# Patient Record
Sex: Female | Born: 1950 | Race: White | Hispanic: No | Marital: Single | Smoking: Current every day smoker
Health system: Southern US, Community
[De-identification: ages and names within clinical notes are randomized; demographics above are authoritative.]

## PROBLEM LIST (undated history)

## (undated) DIAGNOSIS — C801 Malignant (primary) neoplasm, unspecified: Secondary | ICD-10-CM

## (undated) DIAGNOSIS — E785 Hyperlipidemia, unspecified: Secondary | ICD-10-CM

## (undated) DIAGNOSIS — I10 Essential (primary) hypertension: Secondary | ICD-10-CM

## (undated) DIAGNOSIS — N189 Chronic kidney disease, unspecified: Secondary | ICD-10-CM

## (undated) DIAGNOSIS — N824 Other female intestinal-genital tract fistulae: Secondary | ICD-10-CM

## (undated) DIAGNOSIS — F419 Anxiety disorder, unspecified: Secondary | ICD-10-CM

## (undated) DIAGNOSIS — R011 Cardiac murmur, unspecified: Secondary | ICD-10-CM

## (undated) DIAGNOSIS — K5792 Diverticulitis of intestine, part unspecified, without perforation or abscess without bleeding: Secondary | ICD-10-CM

## (undated) DIAGNOSIS — M1990 Unspecified osteoarthritis, unspecified site: Secondary | ICD-10-CM

## (undated) HISTORY — DX: Essential (primary) hypertension: I10

## (undated) HISTORY — PX: BLADDER SURGERY: SHX569

## (undated) HISTORY — DX: Diverticulitis of intestine, part unspecified, without perforation or abscess without bleeding: K57.92

## (undated) HISTORY — PX: COLOSTOMY: SHX63

## (undated) HISTORY — PX: TUBAL LIGATION: SHX77

## (undated) HISTORY — DX: Anxiety disorder, unspecified: F41.9

## (undated) HISTORY — PX: OTHER SURGICAL HISTORY: SHX169

## (undated) HISTORY — DX: Unspecified osteoarthritis, unspecified site: M19.90

## (undated) HISTORY — DX: Malignant (primary) neoplasm, unspecified: C80.1

## (undated) HISTORY — DX: Hyperlipidemia, unspecified: E78.5

## (undated) HISTORY — PX: APPENDECTOMY: SHX54

---

## 2010-03-07 ENCOUNTER — Ambulatory Visit (HOSPITAL_COMMUNITY): Admission: RE | Admit: 2010-03-07 | Discharge: 2010-03-07 | Payer: Self-pay | Admitting: Urology

## 2010-03-13 ENCOUNTER — Ambulatory Visit (HOSPITAL_COMMUNITY)
Admission: RE | Admit: 2010-03-13 | Discharge: 2010-03-13 | Payer: Self-pay | Source: Home / Self Care | Admitting: Urology

## 2010-05-14 LAB — DIFFERENTIAL
Basophils Absolute: 0.1 10*3/uL (ref 0.0–0.1)
Basophils Relative: 1 % (ref 0–1)
Eosinophils Absolute: 0.2 10*3/uL (ref 0.0–0.7)
Eosinophils Relative: 1 % (ref 0–5)
Lymphocytes Relative: 30 % (ref 12–46)
Lymphs Abs: 4 10*3/uL (ref 0.7–4.0)
Monocytes Absolute: 0.8 10*3/uL (ref 0.1–1.0)
Monocytes Relative: 6 % (ref 3–12)
Neutro Abs: 8.2 10*3/uL — ABNORMAL HIGH (ref 1.7–7.7)
Neutrophils Relative %: 62 % (ref 43–77)

## 2010-05-14 LAB — BASIC METABOLIC PANEL
BUN: 10 mg/dL (ref 6–23)
CO2: 26 mEq/L (ref 19–32)
Calcium: 9.4 mg/dL (ref 8.4–10.5)
Chloride: 95 mEq/L — ABNORMAL LOW (ref 96–112)
Creatinine, Ser: 0.89 mg/dL (ref 0.4–1.2)
GFR calc Af Amer: >60 mL/min (ref >60)
GFR calc non Af Amer: >60 mL/min (ref >60)
Glucose, Bld: 81 mg/dL (ref 70–99)
Potassium: 4 mEq/L (ref 3.5–5.1)
Sodium: 134 mEq/L — ABNORMAL LOW (ref 135–145)

## 2010-05-14 LAB — CBC
HCT: 40.3 % (ref 36.0–46.0)
Hemoglobin: 13.8 g/dL (ref 12.0–15.0)
MCH: 29.1 pg (ref 26.0–34.0)
MCHC: 34.2 g/dL (ref 30.0–36.0)
MCV: 85 fL (ref 78.0–100.0)
Platelets: 338 10*3/uL (ref 150–400)
RBC: 4.74 MIL/uL (ref 3.87–5.11)
RDW: 13 % (ref 11.5–15.5)
WBC: 13.1 10*3/uL — ABNORMAL HIGH (ref 4.0–10.5)

## 2010-05-20 ENCOUNTER — Inpatient Hospital Stay (HOSPITAL_COMMUNITY)
Admission: RE | Admit: 2010-05-20 | Discharge: 2010-05-26 | Payer: Self-pay | Source: Home / Self Care | Attending: General Surgery | Admitting: General Surgery

## 2010-05-20 ENCOUNTER — Encounter (INDEPENDENT_AMBULATORY_CARE_PROVIDER_SITE_OTHER): Payer: Self-pay | Admitting: General Surgery

## 2010-05-26 LAB — BASIC METABOLIC PANEL
BUN: 8 mg/dL (ref 6–23)
BUN: 6 mg/dL (ref 6–23)
BUN: 12 mg/dL (ref 6–23)
CO2: 28 mEq/L (ref 19–32)
CO2: 25 mEq/L (ref 19–32)
CO2: 23 mEq/L (ref 19–32)
Calcium: 8.6 mg/dL (ref 8.4–10.5)
Calcium: 8.3 mg/dL — ABNORMAL LOW (ref 8.4–10.5)
Calcium: 8.2 mg/dL — ABNORMAL LOW (ref 8.4–10.5)
Chloride: 96 mEq/L (ref 96–112)
Chloride: 101 mEq/L (ref 96–112)
Chloride: 109 mEq/L (ref 96–112)
Creatinine, Ser: 0.77 mg/dL (ref 0.4–1.2)
Creatinine, Ser: 0.71 mg/dL (ref 0.4–1.2)
Creatinine, Ser: 0.71 mg/dL (ref 0.4–1.2)
GFR calc Af Amer: >60 mL/min (ref >60)
GFR calc Af Amer: >60 mL/min (ref >60)
GFR calc Af Amer: >60 mL/min (ref >60)
GFR calc non Af Amer: >60 mL/min (ref >60)
GFR calc non Af Amer: >60 mL/min (ref >60)
GFR calc non Af Amer: >60 mL/min (ref >60)
Glucose, Bld: 162 mg/dL — ABNORMAL HIGH (ref 70–99)
Glucose, Bld: 142 mg/dL — ABNORMAL HIGH (ref 70–99)
Glucose, Bld: 129 mg/dL — ABNORMAL HIGH (ref 70–99)
Potassium: 4.3 mEq/L (ref 3.5–5.1)
Potassium: 4.8 mEq/L (ref 3.5–5.1)
Potassium: 3.9 mEq/L (ref 3.5–5.1)
Sodium: 132 mEq/L — ABNORMAL LOW (ref 135–145)
Sodium: 134 mEq/L — ABNORMAL LOW (ref 135–145)
Sodium: 140 mEq/L (ref 135–145)

## 2010-05-26 LAB — CBC
HCT: 39.6 % (ref 36.0–46.0)
HCT: 36 % (ref 36.0–46.0)
HCT: 33.5 % — ABNORMAL LOW (ref 36.0–46.0)
Hemoglobin: 13.6 g/dL (ref 12.0–15.0)
Hemoglobin: 12 g/dL (ref 12.0–15.0)
Hemoglobin: 11.2 g/dL — ABNORMAL LOW (ref 12.0–15.0)
MCH: 29 pg (ref 26.0–34.0)
MCH: 28.2 pg (ref 26.0–34.0)
MCH: 28.1 pg (ref 26.0–34.0)
MCHC: 34.3 g/dL (ref 30.0–36.0)
MCHC: 33.3 g/dL (ref 30.0–36.0)
MCHC: 33.4 g/dL (ref 30.0–36.0)
MCV: 84.4 fL (ref 78.0–100.0)
MCV: 84.7 fL (ref 78.0–100.0)
MCV: 84.2 fL (ref 78.0–100.0)
Platelets: 294 10*3/uL (ref 150–400)
Platelets: 275 10*3/uL (ref 150–400)
Platelets: 318 10*3/uL (ref 150–400)
RBC: 4.69 MIL/uL (ref 3.87–5.11)
RBC: 4.25 MIL/uL (ref 3.87–5.11)
RBC: 3.98 MIL/uL (ref 3.87–5.11)
RDW: 12.9 % (ref 11.5–15.5)
RDW: 13 % (ref 11.5–15.5)
RDW: 12.9 % (ref 11.5–15.5)
WBC: 21.1 10*3/uL — ABNORMAL HIGH (ref 4.0–10.5)
WBC: 19.3 10*3/uL — ABNORMAL HIGH (ref 4.0–10.5)
WBC: 15.1 10*3/uL — ABNORMAL HIGH (ref 4.0–10.5)

## 2010-05-26 LAB — GLUCOSE, CAPILLARY
Glucose-Capillary: 121 mg/dL — ABNORMAL HIGH (ref 70–99)
Glucose-Capillary: 138 mg/dL — ABNORMAL HIGH (ref 70–99)
Glucose-Capillary: 142 mg/dL — ABNORMAL HIGH (ref 70–99)
Glucose-Capillary: 112 mg/dL — ABNORMAL HIGH (ref 70–99)
Glucose-Capillary: 124 mg/dL — ABNORMAL HIGH (ref 70–99)
Glucose-Capillary: 129 mg/dL — ABNORMAL HIGH (ref 70–99)
Glucose-Capillary: 142 mg/dL — ABNORMAL HIGH (ref 70–99)
Glucose-Capillary: 148 mg/dL — ABNORMAL HIGH (ref 70–99)
Glucose-Capillary: 149 mg/dL — ABNORMAL HIGH (ref 70–99)
Glucose-Capillary: 164 mg/dL — ABNORMAL HIGH (ref 70–99)
Glucose-Capillary: 170 mg/dL — ABNORMAL HIGH (ref 70–99)
Glucose-Capillary: 117 mg/dL — ABNORMAL HIGH (ref 70–99)
Glucose-Capillary: 120 mg/dL — ABNORMAL HIGH (ref 70–99)
Glucose-Capillary: 123 mg/dL — ABNORMAL HIGH (ref 70–99)
Glucose-Capillary: 124 mg/dL — ABNORMAL HIGH (ref 70–99)
Glucose-Capillary: 138 mg/dL — ABNORMAL HIGH (ref 70–99)
Glucose-Capillary: 140 mg/dL — ABNORMAL HIGH (ref 70–99)
Glucose-Capillary: 143 mg/dL — ABNORMAL HIGH (ref 70–99)
Glucose-Capillary: 143 mg/dL — ABNORMAL HIGH (ref 70–99)
Glucose-Capillary: 154 mg/dL — ABNORMAL HIGH (ref 70–99)
Glucose-Capillary: 108 mg/dL — ABNORMAL HIGH (ref 70–99)
Glucose-Capillary: 120 mg/dL — ABNORMAL HIGH (ref 70–99)
Glucose-Capillary: 120 mg/dL — ABNORMAL HIGH (ref 70–99)
Glucose-Capillary: 126 mg/dL — ABNORMAL HIGH (ref 70–99)

## 2010-05-26 LAB — DIFFERENTIAL
Basophils Absolute: 0 10*3/uL (ref 0.0–0.1)
Basophils Absolute: 0 10*3/uL (ref 0.0–0.1)
Basophils Absolute: 0 10*3/uL (ref 0.0–0.1)
Basophils Relative: 0 % (ref 0–1)
Basophils Relative: 0 % (ref 0–1)
Basophils Relative: 0 % (ref 0–1)
Eosinophils Absolute: 0 10*3/uL (ref 0.0–0.7)
Eosinophils Absolute: 0 10*3/uL (ref 0.0–0.7)
Eosinophils Absolute: 0.1 10*3/uL (ref 0.0–0.7)
Eosinophils Relative: 0 % (ref 0–5)
Eosinophils Relative: 0 % (ref 0–5)
Eosinophils Relative: 0 % (ref 0–5)
Lymphocytes Relative: 7 % — ABNORMAL LOW (ref 12–46)
Lymphocytes Relative: 7 % — ABNORMAL LOW (ref 12–46)
Lymphocytes Relative: 10 % — ABNORMAL LOW (ref 12–46)
Lymphs Abs: 1.5 10*3/uL (ref 0.7–4.0)
Lymphs Abs: 1.4 10*3/uL (ref 0.7–4.0)
Lymphs Abs: 1.5 10*3/uL (ref 0.7–4.0)
Monocytes Absolute: 1.3 10*3/uL — ABNORMAL HIGH (ref 0.1–1.0)
Monocytes Absolute: 1.3 10*3/uL — ABNORMAL HIGH (ref 0.1–1.0)
Monocytes Absolute: 1.3 10*3/uL — ABNORMAL HIGH (ref 0.1–1.0)
Monocytes Relative: 6 % (ref 3–12)
Monocytes Relative: 7 % (ref 3–12)
Monocytes Relative: 9 % (ref 3–12)
Neutro Abs: 18.2 10*3/uL — ABNORMAL HIGH (ref 1.7–7.7)
Neutro Abs: 16.5 10*3/uL — ABNORMAL HIGH (ref 1.7–7.7)
Neutro Abs: 12.2 10*3/uL — ABNORMAL HIGH (ref 1.7–7.7)
Neutrophils Relative %: 87 % — ABNORMAL HIGH (ref 43–77)
Neutrophils Relative %: 86 % — ABNORMAL HIGH (ref 43–77)
Neutrophils Relative %: 81 % — ABNORMAL HIGH (ref 43–77)

## 2010-05-26 LAB — CREATININE, FLUID (PLEURAL, PERITONEAL, JP DRAINAGE): Creat, Fluid: 0.7 mg/dL

## 2010-05-28 LAB — BASIC METABOLIC PANEL
BUN: 9 mg/dL (ref 6–23)
CO2: 23 mEq/L (ref 19–32)
Calcium: 8.3 mg/dL — ABNORMAL LOW (ref 8.4–10.5)
Chloride: 109 mEq/L (ref 96–112)
Creatinine, Ser: 0.61 mg/dL (ref 0.4–1.2)
GFR calc Af Amer: >60 mL/min (ref >60)
GFR calc non Af Amer: >60 mL/min (ref >60)
Glucose, Bld: 113 mg/dL — ABNORMAL HIGH (ref 70–99)
Potassium: 3.7 mEq/L (ref 3.5–5.1)
Sodium: 140 mEq/L (ref 135–145)

## 2010-05-28 LAB — GLUCOSE, CAPILLARY
Glucose-Capillary: 110 mg/dL — ABNORMAL HIGH (ref 70–99)
Glucose-Capillary: 110 mg/dL — ABNORMAL HIGH (ref 70–99)

## 2010-06-03 ENCOUNTER — Ambulatory Visit (HOSPITAL_COMMUNITY)
Admission: RE | Admit: 2010-06-03 | Discharge: 2010-06-03 | Payer: Self-pay | Source: Home / Self Care | Attending: Urology | Admitting: Urology

## 2010-06-05 LAB — SURGICAL PCR SCREEN
MRSA, PCR: NEGATIVE
Staphylococcus aureus: NEGATIVE

## 2010-06-08 ENCOUNTER — Emergency Department (HOSPITAL_COMMUNITY)
Admission: EM | Admit: 2010-06-08 | Discharge: 2010-06-08 | Payer: Self-pay | Source: Home / Self Care | Admitting: Emergency Medicine

## 2010-06-08 LAB — CBC
HCT: 38.1 % (ref 36.0–46.0)
Hemoglobin: 13.4 g/dL (ref 12.0–15.0)
MCH: 28.6 pg (ref 26.0–34.0)
MCHC: 35.2 g/dL (ref 30.0–36.0)
MCV: 81.4 fL (ref 78.0–100.0)
Platelets: 510 10*3/uL — ABNORMAL HIGH (ref 150–400)
RBC: 4.68 MIL/uL (ref 3.87–5.11)
RDW: 12.8 % (ref 11.5–15.5)
WBC: 15.1 10*3/uL — ABNORMAL HIGH (ref 4.0–10.5)

## 2010-06-08 LAB — DIFFERENTIAL
Basophils Absolute: 0.1 10*3/uL (ref 0.0–0.1)
Basophils Relative: 1 % (ref 0–1)
Eosinophils Absolute: 0.6 10*3/uL (ref 0.0–0.7)
Eosinophils Relative: 4 % (ref 0–5)
Lymphocytes Relative: 24 % (ref 12–46)
Lymphs Abs: 3.6 10*3/uL (ref 0.7–4.0)
Monocytes Absolute: 0.9 10*3/uL (ref 0.1–1.0)
Monocytes Relative: 6 % (ref 3–12)
Neutro Abs: 10 10*3/uL — ABNORMAL HIGH (ref 1.7–7.7)
Neutrophils Relative %: 66 % (ref 43–77)

## 2010-06-24 NOTE — Discharge Summary (Signed)
  NAMESHERMEKA, RUTT              ACCOUNT NO.:  [id number]  MEDICAL RECORD NO.:  [id number]          PATIENT TYPE:  INP  LOCATION:  1342                         FACILITY:  [facility]  PHYSICIAN:  Ollen Gross. Vernell Morgans, M.D. DATE OF BIRTH:  22-May-1950  DATE OF ADMISSION:  05/20/2010 DATE OF DISCHARGE:  05/26/2010                              DISCHARGE SUMMARY   HOSPITAL COURSE:  Ms. Sagona is a 60 year old white female who had a colovesical fistula.  She was brought to the operating room on January 10 for resection of the fistula.  This was done in a combined fashion with Dr. Heloise Purpura and Urology.  During the operation, we had to get very low to get down below the area of fistulization, but the colon did appear healthy.  It was felt that it would be more difficult to come back and get down that deep again then to try to put her back together and give her a protective diverting loop ileostomy so we then created a primary anastomosis, sort of as a low anterior resection and then did the proximal diverting loop ileostomy.  She tolerated this very well. She was placed on the Entereg protocol.  She did have diabetes so she was covered with sliding scale insulin postoperatively and her blood sugars were under pretty good control.  Her Foley catheter was left in at least a week, as per Urology, since the bladder had to be open to remove part of the fistula tract.  Her diet was slowly advanced.  Dr. Laverle Patter checked her JP, which was confirmed be serous fluid.  No urine leak.  She was doing very well and by January 16 was tolerating her diet, her pain was under control and she was ready for discharge home.  FINAL DIAGNOSIS:  Colovesical fistula.  ACTIVITY:  No heavy lifting.  DIET:  Diet is as tolerated.  FOLLOWUP:  Follow up will be with Dr. Carolynne Edouard in about two weeks.  MEDICATIONS:  She was to resume her home medications.  She was given a prescription for pain medicine.  DISPOSITION:  She  is discharged home.     Ollen Gross. Vernell Morgans, M.D.     PST/MEDQ  D:  06/20/2010  T:  06/20/2010  Job:  [id number]  Electronically Signed by Chevis Pretty III M.D. on 06/24/2010 10:35:46 AM

## 2010-07-22 LAB — GLUCOSE, CAPILLARY
Glucose-Capillary: 111 mg/dL — ABNORMAL HIGH (ref 70–99)
Glucose-Capillary: 173 mg/dL — ABNORMAL HIGH (ref 70–99)

## 2010-07-23 LAB — COMPREHENSIVE METABOLIC PANEL
AST: 23 U/L (ref 0–37)
Albumin: 4 g/dL (ref 3.5–5.2)
Alkaline Phosphatase: 42 U/L (ref 39–117)
CO2: 28 mEq/L (ref 19–32)
Chloride: 97 mEq/L (ref 96–112)
GFR calc Af Amer: >60 mL/min (ref >60)
GFR calc non Af Amer: >60 mL/min (ref >60)
Potassium: 3.8 mEq/L (ref 3.5–5.1)
Total Bilirubin: 0.3 mg/dL (ref 0.3–1.2)

## 2010-07-23 LAB — CBC
HCT: 41.6 % (ref 36.0–46.0)
Hemoglobin: 14.2 g/dL (ref 12.0–15.0)
MCH: 29.5 pg (ref 26.0–34.0)
MCHC: 34.2 g/dL (ref 30.0–36.0)
MCV: 86.1 fL (ref 78.0–100.0)
Platelets: 364 10*3/uL (ref 150–400)
RBC: 4.84 MIL/uL (ref 3.87–5.11)
RDW: 13.7 % (ref 11.5–15.5)
WBC: 15.4 10*3/uL — ABNORMAL HIGH (ref 4.0–10.5)

## 2010-07-23 LAB — SURGICAL PCR SCREEN
MRSA, PCR: NEGATIVE
Staphylococcus aureus: NEGATIVE

## 2010-08-14 ENCOUNTER — Other Ambulatory Visit: Payer: Self-pay | Admitting: General Surgery

## 2010-08-22 ENCOUNTER — Ambulatory Visit
Admission: RE | Admit: 2010-08-22 | Discharge: 2010-08-22 | Disposition: A | Discharge status: Home / Self Care | Payer: Medicaid Other | Source: Ambulatory Visit | Attending: General Surgery | Admitting: General Surgery

## 2010-09-26 ENCOUNTER — Encounter (HOSPITAL_COMMUNITY)
Admission: RE | Admit: 2010-09-26 | Discharge: 2010-09-26 | Disposition: A | Discharge status: Home / Self Care | Payer: Medicaid Other | Source: Ambulatory Visit | Attending: General Surgery | Admitting: General Surgery

## 2010-09-26 LAB — DIFFERENTIAL
Basophils Absolute: 0.1 10*3/uL (ref 0.0–0.1)
Basophils Relative: 1 % (ref 0–1)
Eosinophils Absolute: 0.5 10*3/uL (ref 0.0–0.7)
Eosinophils Relative: 4 % (ref 0–5)
Monocytes Absolute: 0.7 10*3/uL (ref 0.1–1.0)

## 2010-09-26 LAB — BASIC METABOLIC PANEL
Calcium: 9.8 mg/dL (ref 8.4–10.5)
GFR calc Af Amer: >60 mL/min (ref >60)
GFR calc non Af Amer: 54 mL/min — ABNORMAL LOW (ref >60)
Glucose, Bld: 92 mg/dL (ref 70–99)
Potassium: 4.5 mEq/L (ref 3.5–5.1)
Sodium: 139 mEq/L (ref 135–145)

## 2010-09-26 LAB — CBC
MCHC: 34.1 g/dL (ref 30.0–36.0)
Platelets: 262 10*3/uL (ref 150–400)
RDW: 13.7 % (ref 11.5–15.5)
WBC: 12.3 10*3/uL — ABNORMAL HIGH (ref 4.0–10.5)

## 2010-09-26 LAB — SURGICAL PCR SCREEN
MRSA, PCR: NEGATIVE
Staphylococcus aureus: NEGATIVE

## 2010-10-03 ENCOUNTER — Other Ambulatory Visit (INDEPENDENT_AMBULATORY_CARE_PROVIDER_SITE_OTHER): Payer: Self-pay | Admitting: General Surgery

## 2010-10-03 ENCOUNTER — Inpatient Hospital Stay (HOSPITAL_COMMUNITY)
Admission: RE | Admit: 2010-10-03 | Discharge: 2010-10-07 | DRG: 346 | Disposition: A | Discharge status: Home / Self Care | Payer: Medicaid Other | Source: Ambulatory Visit | Attending: General Surgery | Admitting: General Surgery

## 2010-10-03 DIAGNOSIS — Z432 Encounter for attention to ileostomy: Principal | ICD-10-CM

## 2010-10-03 DIAGNOSIS — I10 Essential (primary) hypertension: Secondary | ICD-10-CM | POA: Diagnosis present

## 2010-10-03 HISTORY — PX: COLON SURGERY: SHX602

## 2010-10-03 LAB — GLUCOSE, CAPILLARY
Glucose-Capillary: 84 mg/dL (ref 70–99)
Glucose-Capillary: 89 mg/dL (ref 70–99)

## 2010-10-04 LAB — BASIC METABOLIC PANEL
CO2: 22 mEq/L (ref 19–32)
Calcium: 9.1 mg/dL (ref 8.4–10.5)
GFR calc Af Amer: >60 mL/min (ref >60)
GFR calc non Af Amer: >60 mL/min (ref >60)
Sodium: 137 mEq/L (ref 135–145)

## 2010-10-04 LAB — GLUCOSE, CAPILLARY
Glucose-Capillary: 109 mg/dL — ABNORMAL HIGH (ref 70–99)
Glucose-Capillary: 94 mg/dL (ref 70–99)

## 2010-10-04 LAB — CBC
Hemoglobin: 12 g/dL (ref 12.0–15.0)
MCHC: 33 g/dL (ref 30.0–36.0)
RDW: 13.2 % (ref 11.5–15.5)
WBC: 13.4 10*3/uL — ABNORMAL HIGH (ref 4.0–10.5)

## 2010-10-04 LAB — DIFFERENTIAL
Basophils Absolute: 0 10*3/uL (ref 0.0–0.1)
Basophils Relative: 0 % (ref 0–1)
Monocytes Absolute: 1.1 10*3/uL — ABNORMAL HIGH (ref 0.1–1.0)
Neutro Abs: 9.9 10*3/uL — ABNORMAL HIGH (ref 1.7–7.7)

## 2010-10-05 LAB — GLUCOSE, CAPILLARY
Glucose-Capillary: 107 mg/dL — ABNORMAL HIGH (ref 70–99)
Glucose-Capillary: 118 mg/dL — ABNORMAL HIGH (ref 70–99)
Glucose-Capillary: 129 mg/dL — ABNORMAL HIGH (ref 70–99)

## 2010-10-05 LAB — BASIC METABOLIC PANEL
CO2: 25 mEq/L (ref 19–32)
Calcium: 8.3 mg/dL — ABNORMAL LOW (ref 8.4–10.5)
Chloride: 106 mEq/L (ref 96–112)
GFR calc Af Amer: >60 mL/min (ref >60)
Sodium: 138 mEq/L (ref 135–145)

## 2010-10-05 LAB — DIFFERENTIAL
Basophils Absolute: 0 10*3/uL (ref 0.0–0.1)
Eosinophils Absolute: 0.4 10*3/uL (ref 0.0–0.7)
Eosinophils Relative: 3 % (ref 0–5)

## 2010-10-05 LAB — CBC
Hemoglobin: 11.3 g/dL — ABNORMAL LOW (ref 12.0–15.0)
MCH: 28.8 pg (ref 26.0–34.0)
MCV: 85 fL (ref 78.0–100.0)
RBC: 3.93 MIL/uL (ref 3.87–5.11)

## 2010-10-06 LAB — GLUCOSE, CAPILLARY
Glucose-Capillary: 108 mg/dL — ABNORMAL HIGH (ref 70–99)
Glucose-Capillary: 135 mg/dL — ABNORMAL HIGH (ref 70–99)

## 2010-10-07 LAB — GLUCOSE, CAPILLARY
Glucose-Capillary: 121 mg/dL — ABNORMAL HIGH (ref 70–99)
Glucose-Capillary: 137 mg/dL — ABNORMAL HIGH (ref 70–99)

## 2010-10-10 NOTE — Op Note (Signed)
Jill Fox, Jill Fox              ACCOUNT NO.:  [id number]  MEDICAL RECORD NO.:  [id number]           PATIENT TYPE:  I  LOCATION:  5151                         FACILITY:  [facility]  PHYSICIAN:  Ollen Gross. Vernell Morgans, M.D. DATE OF BIRTH:  1951/02/21  DATE OF PROCEDURE:  10/03/2010 DATE OF DISCHARGE:                              OPERATIVE REPORT   PREOPERATIVE DIAGNOSIS:  Previous diverting loop ileostomy.  POSTOPERATIVE DIAGNOSIS:  Previous diverting loop ileostomy.  PROCEDURE:  Laparoscopic-assisted ileostomy takedown.  SURGEON:  Ollen Gross. Vernell Morgans, MD  ASSISTANT:  Abigail Miyamoto, MD  ANESTHESIA:  General endotracheal.  PROCEDURE:  After informed consent was obtained, the patient was brought to the operating room and placed in supine position on the operating room table.  After adequate induction of general anesthesia, the patient's ostomy bag was removed.  Her ileostomy was closed with a 2-0 silk purse-string stitch.  Her abdomen was then prepped with Betadine as well as ChloraPrep, allowed to dry and then draped in the usual sterile manner.  In the left upper quadrant, an area was infiltrated with 0.25% Marcaine.  A small incision was made with #15 blade knife and then an OptiVu 5-mm port was used to dissect through the layers of the abdomen under direct vision until we were able to enter the abdomen.  The abdomen was then insufflated with carbon dioxide without difficulty. The 5-mm camera was then used to inspect the abdomen.  There were some adhesions, some omentum, and some small intestine to the anterior abdominal wall, but these adhesions were quite filmy.  We placed another 5-mm port under direct vision just below on the left side of the abdomen, at the previous port.  This area was infiltrated with 0.25% Marcaine.  A small stab incision was made with a #15 blade knife and a 5- mm port was placed bluntly through this incision into the abdominal cavity under direct vision.   Using laparoscopic scissors, the filmy adhesions were taken down and we were able to identify the ileostomy. There was a bit of free space hernia next to the ileostomy, but we freed up the ileostomy so that there was good length of freed small bowel coming to and from the ileostomy.  There were some other denser adhesions along the midline down lower, but these were not near the ileostomy and did not affect the small bowel that we were concerned with.  At this point, an elliptical incision was made then around the ileostomy at the skin level.  This was done with a #15 blade knife. This incision was carried down through the skin and subcutaneous tissue sharply with electrocautery.  The parastomal hernia was opened sharply with electrocautery and then we were able to actually take down the ileostomy completely.  We had plenty of length of small bowel coming up through the ostomy site that was free.  We chose a spot above and below the ostomy where the bowel appeared to be healthy.  We opened the mesentery at each of these points bluntly with a hemostat and took the mesentery down by clamping with hemostats dividing and ligating these  vessels with 2-0 silk ties.  We then made an enterotomy on the antimesenteric border of each limb of small bowel at the chosen spot. Each limb of a GIA 75 stapler was placed down the appropriate limb of the small bowel, clamped, and fired thereby creating a nice widely patent enteroenterostomy.  The common enterotomy was then closed with a TA-60 stapler.  The rest of the mesentery to the ileostomy was then taken down with hemostats, divided, and ligated with 2-0 silk ties.  The mesenteric defect was then closed with figure-of-eight 2-0 silk stitches.  The 2-0 silk crotch stitch was placed and the staple line was dunked with 2-0 silk Lembert stitches.  At this point, the anastomosis appeared to be very healthy, have good blood supply, and was without tension.   The anastomosis was then dropped back into the abdominal cavity without difficulty.  The fascial defect at the ostomy site was closed with #1 Novafil stitches.  We then re-insufflated the abdomen and the ostomy site looked very good.  The gas was then allowed to escape and the ports were removed.  The ostomy subcutaneous area was irrigated with copious amounts of saline and Betadine.  The subcutaneous tissue was closed with interrupted 2-0 Vicryl stitches and the skin was closed with staples.  Betadine ointment and sterile dressings were applied. The patient tolerated the procedure well.  At the end of the case, all needle, sponge, and instrument counts were correct.  The patient was then awakened and taken to the recovery room in stable condition.     Ollen Gross. Vernell Morgans, M.D.     PST/MEDQ  D:  10/03/2010  T:  10/04/2010  Job:  [id number]  Electronically Signed by Chevis Pretty III M.D. on 10/10/2010 09:13:24 AM

## 2010-11-11 NOTE — Discharge Summary (Signed)
  Jill Fox, HERZIG NO.:  [id number]  MEDICAL RECORD NO.:  [id number]  LOCATION:  5151                         FACILITY:  [facility]  PHYSICIAN:  Earney Hamburg, P.A.  DATE OF BIRTH:  24-Mar-1951  DATE OF ADMISSION:  10/03/2010 DATE OF DISCHARGE:  10/07/2010                              DISCHARGE SUMMARY   DISCHARGE DIAGNOSES: 1. Status post ileostomy closure. 2. Left upper extremity rash of unknown origin. 3. Hypertension. 4. Dyslipidemia. 5. Diabetes. 6. Anxiety disorder, not otherwise specified.  CONSULTANTS:  None.  PROCEDURES:  Ileostomy takedown by Dr. Carolynne Edouard.  HISTORY OF PRESENT ILLNESS:  This is an approximately 60 year old female who had a previous diverting loop ileostomy that was ready to be reversed.  She came in for that procedure and have that done lap assisted.  She was then transferred to the floor for her postoperative recovery.  HOSPITAL COURSE:  On postoperative day #1, the patient was doing well. However, her IV need to be switched to her left arm.  She developed a reaction proximal to that IV on the volar surface of her arm, which appeared allergic in nature.  There was not a new medication involved with this.  However, to be on the safe side, the IV was switched back to the other arm, and she was changed to IV Dilaudid instead of IV morphine for pain.  She did well with this. Her reactions subsided, did not progress to systemic symptoms.  Her medical issues remained stable while she was in the hospital.  Her ileus resolved quickly, and she was able to be discharged home in good condition.  DISCHARGE MEDICATIONS:  Percocet, take 1-2 p.o. q.4 h. p.r.n. pain.  In addition, she may resume her home medications, which include: 1. Metoprolol 25 mg twice daily. 2. Pravastatin 20 mg daily. 3. Glipizide 20 mg twice daily. 4. Clonazepam 1 mg tablets one-half to one by mouth daily as needed     for anxiety. 5. Aspirin 325 mg  daily.  FOLLOWUP:  The patient will need to follow up with Dr. Carolynne Edouard in 1 week and will call the office for an appointment.     Earney Hamburg, P.A.     MJ/MEDQ  D:  10/21/2010  T:  10/22/2010  Job:  [id number]  Electronically Signed by Charma Igo P.A. on 11/05/2010 02:39:02 PM Electronically Signed by Chevis Pretty III M.D. on 11/11/2010 09:15:25 AM

## 2010-11-13 ENCOUNTER — Ambulatory Visit (INDEPENDENT_AMBULATORY_CARE_PROVIDER_SITE_OTHER): Payer: Medicaid Other | Admitting: General Surgery

## 2010-11-13 ENCOUNTER — Encounter (INDEPENDENT_AMBULATORY_CARE_PROVIDER_SITE_OTHER): Payer: Self-pay | Admitting: General Surgery

## 2010-11-13 DIAGNOSIS — N824 Other female intestinal-genital tract fistulae: Secondary | ICD-10-CM

## 2010-11-13 MED ORDER — OXYCODONE-ACETAMINOPHEN 5-325 MG PO TABS: 1.0000 | ORAL_TABLET | ORAL | Status: DC | PRN

## 2010-11-13 NOTE — Patient Instructions (Signed)
Continue daily dressing changes Shower daily F/U in 3 weeks

## 2010-11-13 NOTE — Progress Notes (Signed)
Subjective:     Patient ID: Jill Fox, female   DOB: 1950/07/13, 60 y.o.   MRN: [id number]    There were no vitals taken for this visit.    HPI The patient is a 60 year old white female who is about a month and a half out from an ileostomy reversal. She had a previous low anterior resection for a colovesical fistula. She tolerated her surgery well. Her postoperative course has been complicated by a superficial wound infection at her ileostomy site. She's been doing dressing changes and has been taking very good care of the wound. Her appetite is good. Her bowels are working normally. She's had a few issues with constipation. Review of Systems     Objective:   Physical Exam On exam her abdomen is soft and nontender. Her ileostomy incision is almost completely healed now. I can barely get the tip of a Q-tip into the cavity and its only about 2 cm deep. No sign of infection. The wound is clean.   Assessment:     Ileostomy takedown.    Plan:     Continue daily dressing changes. Shower daily. I will plan to see her back in about 3 weeks to recheck the wound.

## 2010-12-17 ENCOUNTER — Encounter (INDEPENDENT_AMBULATORY_CARE_PROVIDER_SITE_OTHER): Payer: Self-pay | Admitting: General Surgery

## 2010-12-18 ENCOUNTER — Ambulatory Visit (INDEPENDENT_AMBULATORY_CARE_PROVIDER_SITE_OTHER): Payer: Medicaid Other | Admitting: General Surgery

## 2010-12-18 ENCOUNTER — Encounter (INDEPENDENT_AMBULATORY_CARE_PROVIDER_SITE_OTHER): Payer: Self-pay | Admitting: General Surgery

## 2010-12-18 VITALS — Ht 60.0 in | Wt 159.0 lb

## 2010-12-18 DIAGNOSIS — K5732 Diverticulitis of large intestine without perforation or abscess without bleeding: Secondary | ICD-10-CM

## 2010-12-18 NOTE — Patient Instructions (Signed)
May return to all normal activities 

## 2010-12-18 NOTE — Progress Notes (Signed)
Subjective:     Patient ID: Jill Fox, female   DOB: 26-Apr-1951, 60 y.o.   MRN: [id number]  HPI The patient is a 60 year old white female who is now about 2 half months out from an ostomy takedown. She had a previous sigmoid colectomy and repair of a colovesical fistula. She has done very well since the surgery. Her postoperative course was prompted by a small wound infection at the ileostomy site. This is pretty much healed now. Her appetite is good and her bowels are moving regularly  Review of Systems     Objective:   Physical Exam On exam her abdomen is soft. She has minimal tenderness around the incision. The incisions healing well. There is no sign of infection. Her ileostomy site is completely healed with no sign of infection.    Assessment:     2 half months postop from takedown of ileostomy.    Plan:     At this point she can return to her normal activities. She will no longer need any dressing changes. We will plan to see her back in a couple months just to check her progress one more time.

## 2011-03-24 ENCOUNTER — Ambulatory Visit (INDEPENDENT_AMBULATORY_CARE_PROVIDER_SITE_OTHER): Payer: Medicaid Other | Admitting: General Surgery

## 2011-03-24 ENCOUNTER — Encounter (INDEPENDENT_AMBULATORY_CARE_PROVIDER_SITE_OTHER): Payer: Medicaid Other | Admitting: General Surgery

## 2011-03-24 VITALS — BP 130/82 | HR 100 | Temp 98.2°F | Resp 12 | Ht 60.0 in | Wt 173.0 lb

## 2011-03-24 DIAGNOSIS — K5732 Diverticulitis of large intestine without perforation or abscess without bleeding: Secondary | ICD-10-CM

## 2011-03-24 NOTE — Patient Instructions (Signed)
May return to all normal activities 

## 2011-03-25 ENCOUNTER — Encounter (INDEPENDENT_AMBULATORY_CARE_PROVIDER_SITE_OTHER): Payer: Medicaid Other | Admitting: General Surgery

## 2011-03-25 ENCOUNTER — Encounter (INDEPENDENT_AMBULATORY_CARE_PROVIDER_SITE_OTHER): Payer: Self-pay | Admitting: General Surgery

## 2011-03-25 NOTE — Progress Notes (Signed)
Subjective:     Patient ID: Jill Fox, female   DOB: 07/24/1950, 60 y.o.   MRN: [id number]  HPI The patient is a 60 year old white female who is now almost 6 months out from a colostomy reversal. Her postoperative course was complicated by a superficial wound infection. This is now completely healed. She has no complaints today. She is pretty much back to all her normal activities. Her appetite is good and her bowels are working normally.  Review of Systems     Objective:   Physical Exam On exam her abdomen is soft and nontender. Her incisions are all healed. She has no palpable evidence for  hernia at this point.    Assessment:     6 months out from a colostomy reversal    Plan:     At this point I believe she can return to normal activities without any restrictions. We will plan to see her back on a p.r.n. basis .

## 2011-05-20 ENCOUNTER — Encounter (HOSPITAL_COMMUNITY): Payer: Self-pay

## 2011-05-20 ENCOUNTER — Other Ambulatory Visit: Payer: Self-pay | Admitting: Urology

## 2011-05-22 ENCOUNTER — Encounter (HOSPITAL_COMMUNITY)
Admission: RE | Admit: 2011-05-22 | Discharge: 2011-05-22 | Disposition: A | Discharge status: Home / Self Care | Payer: Medicare Other | Source: Ambulatory Visit | Attending: Urology | Admitting: Urology

## 2011-05-22 ENCOUNTER — Encounter (HOSPITAL_COMMUNITY): Payer: Self-pay

## 2011-05-22 HISTORY — DX: Chronic kidney disease, unspecified: N18.9

## 2011-05-22 HISTORY — DX: Cardiac murmur, unspecified: R01.1

## 2011-05-22 LAB — CBC
Hemoglobin: 12.6 g/dL (ref 12.0–15.0)
MCH: 28 pg (ref 26.0–34.0)
MCHC: 33.2 g/dL (ref 30.0–36.0)
MCV: 84.4 fL (ref 78.0–100.0)
Platelets: 312 10*3/uL (ref 150–400)
RBC: 4.5 MIL/uL (ref 3.87–5.11)

## 2011-05-22 LAB — BASIC METABOLIC PANEL
CO2: 24 mEq/L (ref 19–32)
Calcium: 9.9 mg/dL (ref 8.4–10.5)
Creatinine, Ser: 1.04 mg/dL (ref 0.50–1.10)
GFR calc non Af Amer: 57 mL/min — ABNORMAL LOW (ref >90)
Glucose, Bld: 112 mg/dL — ABNORMAL HIGH (ref 70–99)

## 2011-05-22 NOTE — Patient Instructions (Signed)
[name]  05/22/2011   Your procedure is scheduled on:  05/25/11 245pm-345pm  Report to [facility] at 1245 pm.  Call this number if you have problems the morning of surgery: [phone]   Remember:   Do not eat food:After Midnight.  May have clear liquids:until 0830 am then npo .  Clear liquids include soda, tea, black coffee, apple or grape juice, broth.  Take these medicines the morning of surgery with A SIP OF WATER:    Do not wear jewelry, make-up or nail polish.  Do not wear lotions, powders, or perfumes.  Do not shave 48 hours prior to surgery.  Do not bring valuables to the hospital.  Contacts, dentures or bridgework may not be worn into surgery. .    Patients discharged the day of surgery will not be allowed to drive home.  Name and phone number of your driver:   Special Instructions: CHG Shower Use Special Wash: 1/2 bottle night before surgery and 1/2 bottle morning of surgery., shower chin to toes with CHG.  Wash face and private parts with regular soap.     Please read over the following fact sheets that you were given: MRSA Information, coughing and deep breathing exercises, leg exercises

## 2011-05-22 NOTE — Pre-Procedure Instructions (Signed)
08/06/10 CXR on chart  08/06/10 EKG on chart

## 2011-05-24 NOTE — H&P (Signed)
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 She was noted to have air in her left renal collecting system in July 2010 by Dr. Doran Durand during an evaluation for hematuria and underwent further evaluation and was found to have a right-sided 3 cm bladder tumor. She also described symptoms including pneumaturia at that time. She apparently was referred to [facility] at that time and underwent a TURBT and evaluation for a possible fistula on 02/07/09. 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  No adjuvant intravesical chemotherapy was administered. There was a questionable fistulous tract identified near her left ureteral orifice during her cystoscopic evaluation at that time. Attempts to cannulate it were unsuccessful and no clear evidence of a fistula was identified. She subsequently saw General Surgery at [facility] who had recommended a followup CT scan for further evaluation but she did not followup for this testing or evaluation. She then returned to the care of Dr. Merry Lofty and has been undergoing surveillance without evidence of cancer recurrence until July 2011 when she was noted to have a small left-sided bladder tumor which was biopsied and fulgurated in the office. The pathology revealed "in situ, low-grade papillary urothelial carcinoma" indicating a presumed recurrent low-grade, Ta urothelial tumor. Apparently, there were again concerns about a urinary fistula which prompted further evaluation in the OR by Dr. Merry Lofty on 12/18/09 at which time contrast was injected into a possible fistula opening located between her native left ureteral orifice and the reimplanted left ureter at the bladder dome. This  apparently demonstrated a fistulous connection between the bladder and rectum. I am not aware that she has been referred for a General Surgery evaluation by Dr. Merry Lofty since this more recent finding. She was then apparently sent for evaluation by Dr. 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 She has apparently undergone a recent colonscopy by her gastroenterologist, Dr. Noe Gens, which was unremarkable except for a few small polyps that were removed. On 03/13/10, she underwent endoscopic evaluation which demonstrated normal upper tracts with a left ureteral reimplantation. She had no bladder tumors and biopsies of the bladder and her apparent fistula were negative for malignancy. She did appear to have a communication between her posterior left  bladder and the sigmoid colon with a fistula tract noted between her native left ureteral orifice and her new left ureteral orifice. She is s/p repair of her colovesical fistula by Dr. Carolynne Edouard and myself on 05/20/10.  Last cysto: Sep 2012 (negative) Last upper tract imaging: Oct 2011  Interval history:  She follows up today for bladder cancer surveillance. She denies any recent hematuria or other voiding symptoms.   Past Medical History Problems  1. History of  Bladder Cancer V10.51 2. History of  Diabetes Mellitus 250.00 3. History of  Diverticulitis Of Colon 562.11 4. History of  Hypertension 401.9 5. History of  Murmurs 785.2  Surgical History Problems  1. History of  Closure Of Enterovesical Fistula 2. History of  Cystoscopy With Biopsy 3. History of  Cystoscopy With Insertion Of Ureteral Stent Left 4. History of  Partial Colectomy 5. History of  Tubal Ligation V25.2 6. History of  Vesicoureteral  Reimplantation  Current Meds 1. Aspirin 325 MG Oral Tablet; Therapy: (Recorded:11May2012) to 2. GlipiZIDE 5 MG Oral Tablet; Therapy: (Recorded:11Sep2012)  to 3. Lisinopril-Hydrochlorothiazide 10-12.5 MG Oral Tablet; Therapy: 09Aug2012 to 4. Pravastatin Sodium 20 MG Oral Tablet; Therapy: (Recorded:23Aug2011) to  Allergies Medication  1. No Known Drug Allergies  Family History Problems  1. Paternal history of  Heart Disease V17.49 Denied  2. Family history of  Bladder Cancer 3. Family history of  Kidney Cancer  Social History Problems  1. Marital History - Single 2. Occupation: disabled 3. Tobacco Use V15.82 smokes 1/2 pack dailysmoking for 40 years Denied  4. History of  Alcohol Use  Review of Systems  Genitourinary: no hematuria.  Constitutional: no fever.    Vitals Vital Signs [Data Includes: Last 1 Day]  09Jan2013 09:47AM  BMI Calculated: 32.39 BSA Calculated: 1.72 Height: 5 ft  Weight: 165 lb  Blood Pressure: 84 / 55 Heart Rate: 87  Physical Exam Constitutional: Well nourished and well developed . No acute distress.  Pulmonary: No respiratory distress and normal respiratory rhythm and effort.  Cardiovascular: Heart rate and rhythm are normal . No peripheral edema.  Genitourinary: Examination of the external genitalia shows normal female external genitalia. There is no urethral mass.    Results/Data Urine [Data Includes: Last 1 Day]   09Jan2013  COLOR YELLOW   APPEARANCE CLEAR   SPECIFIC GRAVITY 1.030   pH 5.5   GLUCOSE NEG mg/dL  BILIRUBIN NEG   KETONE NEG mg/dL  BLOOD TRACE   PROTEIN NEG mg/dL  UROBILINOGEN 0.2 mg/dL  NITRITE NEG   LEUKOCYTE ESTERASE NEG   SQUAMOUS EPITHELIAL/HPF FEW   WBC NONE SEEN WBC/hpf  RBC NONE SEEN RBC/hpf  BACTERIA FEW   CRYSTALS NONE SEEN   CASTS Hyaline casts noted    Procedure  Procedure: Cystoscopy  Indication: History of Urothelial Carcinoma.  Informed Consent: Risks, benefits, and potential adverse events were discussed and informed consent was obtained from the patient.  Prep: The patient was prepped with betadine.  Antibiotic prophylaxis: Ciprofloxacin.   Procedure Note:  Urethral meatus:. No abnormalities.  Anterior urethra: No abnormalities.  Bladder: Visulization was clear. A systematic examination of the bladder was performed. The bilateral ureteral orifice these were noted in their normal anatomic position. Her reimplanted left ureter was noted lateral and superior to the native left ureteral orifice. There was noted to be a small papillary and low-grade appearing tumor in the posterior aspect of the bladder just to the right of the midline. No other bladder tumors or other abnormalities were identified. This tumor measured approximately 1 cm. A saline bladder washing was obtained and sent for cytologic analysis. The patient tolerated the procedure well.  Complications: None.    Assessment Assessed  1. Bladder Cancer 188.9  Plan Bladder Cancer (188.9)  1. Follow-up Schedule Surgery Office  Follow-up  Requested for: 09Jan2013 Health Maintenance (V70.0)  2. UA With REFLEX  Done: 09Jan2013 09:38AM  Discussion/Summary  1. Urothelial carcinoma of the bladder: She does have a small tumor recurrence today and I recommended proceeding with cystoscopy and transurethral resection of her bladder tumor. I also recommended that we perform retrograde pyelography will bear to fully examine her upper tracts at this point. We have reviewed the potential risks and complications associated with this procedure and she agrees to proceed and gives her informed consent. This will be performed in the near future. She will stop her aspirin today in preparation.   Signatures Electronically signed by : Heloise Purpura,  M.D.; May 20 2011 11:00AM

## 2011-05-25 ENCOUNTER — Ambulatory Visit (HOSPITAL_COMMUNITY): Payer: Medicare Other | Admitting: *Deleted

## 2011-05-25 ENCOUNTER — Ambulatory Visit (HOSPITAL_COMMUNITY)
Admission: RE | Admit: 2011-05-25 | Discharge: 2011-05-25 | Disposition: A | Discharge status: Home / Self Care | Payer: Medicare Other | Source: Ambulatory Visit | Attending: Urology | Admitting: Urology

## 2011-05-25 ENCOUNTER — Encounter (HOSPITAL_COMMUNITY): Payer: Self-pay

## 2011-05-25 ENCOUNTER — Encounter (HOSPITAL_COMMUNITY): Payer: Self-pay | Admitting: *Deleted

## 2011-05-25 ENCOUNTER — Other Ambulatory Visit: Payer: Self-pay | Admitting: Urology

## 2011-05-25 ENCOUNTER — Encounter (HOSPITAL_COMMUNITY)
Admission: RE | Disposition: A | Discharge status: Home / Self Care | Payer: Self-pay | Source: Ambulatory Visit | Attending: Urology

## 2011-05-25 DIAGNOSIS — C679 Malignant neoplasm of bladder, unspecified: Secondary | ICD-10-CM

## 2011-05-25 DIAGNOSIS — E119 Type 2 diabetes mellitus without complications: Secondary | ICD-10-CM | POA: Insufficient documentation

## 2011-05-25 DIAGNOSIS — Z7982 Long term (current) use of aspirin: Secondary | ICD-10-CM | POA: Insufficient documentation

## 2011-05-25 DIAGNOSIS — Z01812 Encounter for preprocedural laboratory examination: Secondary | ICD-10-CM | POA: Insufficient documentation

## 2011-05-25 DIAGNOSIS — I10 Essential (primary) hypertension: Secondary | ICD-10-CM | POA: Insufficient documentation

## 2011-05-25 DIAGNOSIS — C672 Malignant neoplasm of lateral wall of bladder: Secondary | ICD-10-CM | POA: Insufficient documentation

## 2011-05-25 DIAGNOSIS — Z79899 Other long term (current) drug therapy: Secondary | ICD-10-CM | POA: Insufficient documentation

## 2011-05-25 HISTORY — PX: TRANSURETHRAL RESECTION OF BLADDER TUMOR: SHX2575

## 2011-05-25 HISTORY — PX: CYSTOSCOPY W/ RETROGRADES: SHX1426

## 2011-05-25 SURGERY — CYSTOSCOPY, WITH RETROGRADE PYELOGRAM
Anesthesia: General | Wound class: Clean Contaminated

## 2011-05-25 MED ORDER — FENTANYL CITRATE 0.05 MG/ML IJ SOLN: 25.0000 ug | INTRAMUSCULAR | Status: DC | PRN

## 2011-05-25 MED ORDER — LACTATED RINGERS IV SOLN
INTRAVENOUS | Status: DC | PRN
  Administered 2011-05-25 (×2): via INTRAVENOUS

## 2011-05-25 MED ORDER — ONDANSETRON HCL 4 MG/2ML IJ SOLN
INTRAMUSCULAR | Status: DC | PRN
  Administered 2011-05-25: 4 mg via INTRAVENOUS

## 2011-05-25 MED ORDER — CIPROFLOXACIN IN D5W 400 MG/200ML IV SOLN
400.0000 mg | INTRAVENOUS | Status: AC
  Administered 2011-05-25: 400 mg via INTRAVENOUS

## 2011-05-25 MED ORDER — STERILE WATER FOR IRRIGATION IR SOLN
Status: DC | PRN
  Administered 2011-05-25: 3000 mL

## 2011-05-25 MED ORDER — IOHEXOL 300 MG/ML  SOLN
INTRAMUSCULAR | Status: AC
  Filled 2011-05-25: qty 1

## 2011-05-25 MED ORDER — LACTATED RINGERS IV SOLN: INTRAVENOUS | Status: DC

## 2011-05-25 MED ORDER — SODIUM CHLORIDE 0.9 % IR SOLN
Status: DC | PRN
  Administered 2011-05-25: 1000 mL

## 2011-05-25 MED ORDER — PROPOFOL 10 MG/ML IV BOLUS
INTRAVENOUS | Status: DC | PRN
  Administered 2011-05-25: 200 mg via INTRAVENOUS

## 2011-05-25 MED ORDER — CIPROFLOXACIN IN D5W 400 MG/200ML IV SOLN
INTRAVENOUS | Status: AC
  Filled 2011-05-25: qty 200

## 2011-05-25 MED ORDER — EPHEDRINE SULFATE 50 MG/ML IJ SOLN
INTRAMUSCULAR | Status: DC | PRN
  Administered 2011-05-25: 5 mg via INTRAVENOUS
  Administered 2011-05-25: 10 mg via INTRAVENOUS

## 2011-05-25 MED ORDER — FENTANYL CITRATE 0.05 MG/ML IJ SOLN
INTRAMUSCULAR | Status: DC | PRN
  Administered 2011-05-25: 50 ug via INTRAVENOUS
  Administered 2011-05-25: 25 ug via INTRAVENOUS
  Administered 2011-05-25: 50 ug via INTRAVENOUS

## 2011-05-25 MED ORDER — IOHEXOL 300 MG/ML  SOLN
INTRAMUSCULAR | Status: DC | PRN
  Administered 2011-05-25: 50 mL

## 2011-05-25 MED ORDER — LIDOCAINE HCL (CARDIAC) 20 MG/ML IV SOLN
INTRAVENOUS | Status: DC | PRN
  Administered 2011-05-25: 40 mg via INTRAVENOUS

## 2011-05-25 MED ORDER — PROMETHAZINE HCL 25 MG/ML IJ SOLN: 6.2500 mg | INTRAMUSCULAR | Status: DC | PRN

## 2011-05-25 MED ORDER — LACTATED RINGERS IV SOLN
INTRAVENOUS | Status: DC
  Administered 2011-05-25: 1000 mL via INTRAVENOUS

## 2011-05-25 MED ORDER — OXYCODONE-ACETAMINOPHEN 5-325 MG PO TABS: 1.0000 | ORAL_TABLET | ORAL | Status: AC | PRN

## 2011-05-25 SURGICAL SUPPLY — 23 items
ADAPTER CATH URET PLST 4-6FR (CATHETERS) ×3 IMPLANT
BAG URINE DRAINAGE (UROLOGICAL SUPPLIES) ×3 IMPLANT
BAG URO CATCHER STRL LF (DRAPE) ×3 IMPLANT
CATH INTERMIT  6FR 70CM (CATHETERS) ×3 IMPLANT
CATH URET 5FR 28IN CONE TIP (BALLOONS)
CATH URET 5FR 70CM CONE TIP (BALLOONS) IMPLANT
CLOTH BEACON ORANGE TIMEOUT ST (SAFETY) ×3 IMPLANT
DRAPE CAMERA CLOSED 9X96 (DRAPES) ×3 IMPLANT
ELECT REM PT RETURN 9FT ADLT (ELECTROSURGICAL) ×3
ELECTRODE REM PT RTRN 9FT ADLT (ELECTROSURGICAL) ×2 IMPLANT
EVACUATOR MICROVAS BLADDER (UROLOGICAL SUPPLIES) IMPLANT
GLOVE BIOGEL M STRL SZ7.5 (GLOVE) ×3 IMPLANT
GOWN STRL NON-REIN LRG LVL3 (GOWN DISPOSABLE) ×6 IMPLANT
GUIDEWIRE STR DUAL SENSOR (WIRE) ×3 IMPLANT
KIT ASPIRATION TUBING (SET/KITS/TRAYS/PACK) IMPLANT
LASER FIBER DISP (UROLOGICAL SUPPLIES) IMPLANT
LOOPS RESECTOSCOPE DISP (ELECTROSURGICAL) ×3 IMPLANT
MANIFOLD NEPTUNE II (INSTRUMENTS) ×3 IMPLANT
NS IRRIG 1000ML POUR BTL (IV SOLUTION) IMPLANT
PACK CYSTO (CUSTOM PROCEDURE TRAY) ×3 IMPLANT
SYRINGE IRR TOOMEY STRL 70CC (SYRINGE) ×3 IMPLANT
TUBING CONNECTING 10 (TUBING) ×3 IMPLANT
WIRE COONS/BENSON .038X145CM (WIRE) IMPLANT

## 2011-05-25 NOTE — Transfer of Care (Signed)
Immediate Anesthesia Transfer of Care Note  Patient: Jill Fox  Procedure(s) Performed:  CYSTOSCOPY WITH RETROGRADE PYELOGRAM - C-ARM; TRANSURETHRAL RESECTION OF BLADDER TUMOR (TURBT)  Patient Location: PACU  Anesthesia Type: General  Level of Consciousness: awake, alert  and patient cooperative  Airway & Oxygen Therapy: Patient Spontanous Breathing and Patient connected to face mask oxygen  Post-op Assessment: Report given to PACU RN, Post -op Vital signs reviewed and stable and Patient moving all extremities  Post vital signs: Reviewed and stable Filed Vitals:   05/25/11 1232  BP: 117/76  Pulse: 77  Temp: 36.8 C  Resp: 18    Complications: No apparent anesthesia complications

## 2011-05-25 NOTE — Op Note (Signed)
Preoperative diagnosis: 1. Bladder tumor (1 cm)  Postoperative diagnosis:  1. Bladder tumor x 3 (1 cm, 0.5 cm, 0.5 cm)  Procedure:  1. Cystoscopy 2. Transurethral resection of bladder tumors 3. Bilateral retrograde pyelography with interpretation  Surgeon: Moody Bruins. M.D.  Anesthesia: General  Complications: None  Intraoperative findings:  1. Bladder tumor: She had 3 small papillary bladder tumors including a 1 cm tumor posteriorly just to right of midline, a 0.5 cm tumor on the right lateral wall, and a 0.5 cm tumor on the left bladder wall. 2. Retrograde pyelography: The right ureter and renal collecting system demonstrated no filling defects. The left ureteral stump had no filling defects. The reimplanted left ureter and renal collecting system were without filling defects.  EBL: Minimal  Specimens: 1. Posterior bladder tumor 2. Right lateral bladder tumor 3. Left bladder tumor  Disposition of specimens: Pathology  Indication: Jill Fox is a patient who was found to have a bladder tumor. After reviewing the management options for treatment, he elected to proceed with the above surgical procedure(s). We have discussed the potential benefits and risks of the procedure, side effects of the proposed treatment, the likelihood of the patient achieving the goals of the procedure, and any potential problems that might occur during the procedure or recuperation. Informed consent has been obtained.  Description of procedure:  The patient was taken to the operating room and general anesthesia was induced.  The patient was placed in the dorsal lithotomy position, prepped and draped in the usual sterile fashion, and preoperative antibiotics were administered. A preoperative time-out was performed.   Cystourethroscopy was performed.  The patient's urethra was examined and was normal.  The bladder was then systematically examined in its entirety. Her native ureteral  orifices were identified in their expected positions.  Her reimplanted left ureter was identified in the left lateral bladder urothelium superior to the native ureteral orifice. 3 separate small, low grade appearing papillary bladder tumors were noted as noted above.  Attention then turned to the right ureteral orifice and a ureteral catheter was used to intubate the ureteral orifice.  Omnipaque contrast was injected through the ureteral catheter and a retrograde pyelogram was performed with findings as dictated above.  Attention then turned to the native left ureteral orifice and a ureteral catheter was used to intubate the ureteral orifice.  Omnipaque contrast was injected through the ureteral catheter and a retrograde pyelogram was performed with findings as dictated above. An identical procedure was then performed to the reimplanted left ureter.  The bladder was then re-examined after the resectoscope was placed.  The bladder tumors were 1 cm, 0.5 cm, and 0.5 cm respectively.  The first was located posteriorly just to the right of midline and appeared papillary. The second tumor was located on the right lateral wall and appeared papillary. Finally, the third tumor was located on the left wall between the native ureteral orifice and the reimplanted left ureter and appeared papillary. Using the cold cup forceps, all tumors were resected and removed for permanent pathologic analysis.   Hemostasis was then achieved with the bugbee electrode and the bladder was emptied and reinspected with no further bleeding noted at the end of the procedure.    The bladder was then emptied and the procedure ended.  The patient appeared to tolerate the procedure well and without complications.  The patient was able to be awakened and transferred to the recovery unit in satisfactory condition.    Recardo Evangelist  Samul Dada. MD

## 2011-05-25 NOTE — Anesthesia Preprocedure Evaluation (Addendum)
Anesthesia Evaluation  Patient identified by MRN, date of birth, ID band Patient awake    Reviewed: Allergy & Precautions, H&P , NPO status , Patient's Chart, lab work & pertinent test results, reviewed documented beta blocker date and time   Airway Mallampati: II TM Distance: >3 FB Neck ROM: full    Dental  (+) Edentulous Upper and Dental Advisory Given   Pulmonary neg pulmonary ROS,  clear to auscultation  Pulmonary exam normal       Cardiovascular Exercise Tolerance: Good hypertension, On Home Beta Blockers + Valvular Problems/Murmurs regular Normal+ Systolic murmurs    Neuro/Psych Anxiety Negative Neurological ROS  Negative Psych ROS   GI/Hepatic negative GI ROS, Neg liver ROS,   Endo/Other  Diabetes mellitus-, Type 2, Oral Hypoglycemic Agents  Renal/GU negative Renal ROS  Genitourinary negative   Musculoskeletal   Abdominal   Peds  Hematology negative hematology ROS (+)   Anesthesia Other Findings   Reproductive/Obstetrics negative OB ROS                          Anesthesia Physical Anesthesia Plan  ASA: III  Anesthesia Plan: General   Post-op Pain Management:    Induction: Intravenous  Airway Management Planned: LMA  Additional Equipment:   Intra-op Plan:   Post-operative Plan:   Informed Consent: I have reviewed the patients History and Physical, chart, labs and discussed the procedure including the risks, benefits and alternatives for the proposed anesthesia with the patient or authorized representative who has indicated his/her understanding and acceptance.   Dental Advisory Given  Plan Discussed with: CRNA and Surgeon  Anesthesia Plan Comments:         Anesthesia Quick Evaluation

## 2011-05-25 NOTE — Progress Notes (Signed)
Patient ambulated with assist to bathroom. Voided. VSS. Verbalizes understanding of d/c instructions. Home with friend.

## 2011-05-25 NOTE — Preoperative (Signed)
Beta Blockers   Reason not to administer Beta Blockers:Not Applicable, pt took BB this am 

## 2011-05-25 NOTE — Interval H&P Note (Signed)
History and Physical Interval Note:  05/25/2011 1:17 PM  Jill Fox  has presented today for surgery, with the diagnosis of Bladder Cancer  The various methods of treatment have been discussed with the patient and family. After consideration of risks, benefits and other options for treatment, the patient has consented to  Procedure(s): CYSTOSCOPY WITH RETROGRADE PYELOGRAM TRANSURETHRAL RESECTION OF BLADDER TUMOR (TURBT) as a surgical intervention .  The patients' history has been reviewed, patient examined, no change in status, stable for surgery.  I have reviewed the patients' chart and labs.  Questions were answered to the patient's satisfaction.     Seville Downs,LES

## 2011-05-25 NOTE — Anesthesia Postprocedure Evaluation (Signed)
  Anesthesia Post-op Note  Patient: Jill Fox  Procedure(s) Performed:  CYSTOSCOPY WITH RETROGRADE PYELOGRAM - C-ARM; TRANSURETHRAL RESECTION OF BLADDER TUMOR (TURBT)  Patient Location: PACU  Anesthesia Type: General  Level of Consciousness: awake and alert   Airway and Oxygen Therapy: Patient Spontanous Breathing  Post-op Pain: mild  Post-op Assessment: Post-op Vital signs reviewed, Patient's Cardiovascular Status Stable, Respiratory Function Stable, Patent Airway and No signs of Nausea or vomiting  Post-op Vital Signs: stable  Complications: No apparent anesthesia complications

## 2011-05-27 ENCOUNTER — Encounter (HOSPITAL_COMMUNITY): Payer: Self-pay | Admitting: Urology

## 2012-01-25 ENCOUNTER — Encounter (INDEPENDENT_AMBULATORY_CARE_PROVIDER_SITE_OTHER): Payer: Self-pay | Admitting: General Surgery

## 2012-05-25 ENCOUNTER — Other Ambulatory Visit: Payer: Self-pay | Admitting: Urology

## 2012-06-16 ENCOUNTER — Encounter (HOSPITAL_COMMUNITY): Payer: Self-pay | Admitting: Pharmacy Technician

## 2012-06-17 ENCOUNTER — Other Ambulatory Visit (HOSPITAL_COMMUNITY): Payer: Self-pay | Admitting: Urology

## 2012-06-17 NOTE — Patient Instructions (Addendum)
[name]  06/17/2012   Your procedure is scheduled on:  07/01/12  FRIDAY  Report to [facility] at     1000  AM.  Call this number if you have problems the morning of surgery: [phone]     DO NOT TAKE ANY DIABETES MEDICATIONS Friday MORNING  Of surgery  Remember: EAT SNACK BEFORE BEDTIME OR MIDNIGHT Thursday NIGHT- THEN NOTHING BY MOUTH  Do not eat food  Or drink :After Midnight. Thursday NIGHT   Take these medicines the morning of surgery with A SIP OF WATER: ATENOLOL,   OXY  IR                                    MAY TAKE APRAZOLAM IF NEEDED   .  Contacts, dentures or partial plates can not be worn to surgery  Leave suitcase in the car. After surgery it may be brought to your room.  For patients admitted to the hospital, checkout time is 11:00 AM day of  discharge.             SPECIAL INSTRUCTIONS- SEE [name] PREPARING FOR SURGERY INSTRUCTION SHEET-     DO NOT WEAR JEWELRY, LOTIONS, POWDERS, OR PERFUMES.  WOMEN-- DO NOT SHAVE LEGS OR UNDERARMS FOR 12 HOURS BEFORE SHOWERS. MEN MAY SHAVE FACE.  Patients discharged the day of surgery will not be allowed to drive home. IF going home the day of surgery, you must have a driver and someone to stay with you for the first 24 hours  Name and phone number of your driver:  [name]     [phone]                                                                      Please read over the following fact sheets that you were given: MRSA Information, Incentive Spirometry Sheet, Blood Transfusion Sheet  Information                                                                                   [name]  PST [phone]  [phone]                 FAILURE TO FOLLOW THESE INSTRUCTIONS MAY RESULT IN  CANCELLATION   OF YOUR SURGERY                                                  Patient Signature _____________________________

## 2012-06-20 ENCOUNTER — Ambulatory Visit (HOSPITAL_COMMUNITY)
Admission: RE | Admit: 2012-06-20 | Discharge: 2012-06-20 | Disposition: A | Discharge status: Home / Self Care | Payer: Medicare Other | Source: Ambulatory Visit | Attending: Urology | Admitting: Urology

## 2012-06-20 ENCOUNTER — Encounter (HOSPITAL_COMMUNITY)
Admission: RE | Admit: 2012-06-20 | Discharge: 2012-06-20 | Disposition: A | Discharge status: Home / Self Care | Payer: Medicare Other | Source: Ambulatory Visit | Attending: Urology | Admitting: Urology

## 2012-06-20 ENCOUNTER — Encounter (HOSPITAL_COMMUNITY): Payer: Self-pay

## 2012-06-20 DIAGNOSIS — Z01812 Encounter for preprocedural laboratory examination: Secondary | ICD-10-CM | POA: Insufficient documentation

## 2012-06-20 DIAGNOSIS — D494 Neoplasm of unspecified behavior of bladder: Secondary | ICD-10-CM | POA: Insufficient documentation

## 2012-06-20 DIAGNOSIS — Z01810 Encounter for preprocedural cardiovascular examination: Secondary | ICD-10-CM | POA: Insufficient documentation

## 2012-06-20 LAB — BASIC METABOLIC PANEL
BUN: 25 mg/dL — ABNORMAL HIGH (ref 6–23)
CO2: 25 mEq/L (ref 19–32)
Chloride: 101 mEq/L (ref 96–112)
GFR calc Af Amer: 60 mL/min — ABNORMAL LOW (ref >90)
Glucose, Bld: 161 mg/dL — ABNORMAL HIGH (ref 70–99)
Potassium: 4.8 mEq/L (ref 3.5–5.1)

## 2012-06-20 LAB — CBC
HCT: 39.6 % (ref 36.0–46.0)
Hemoglobin: 12.9 g/dL (ref 12.0–15.0)
MCHC: 32.6 g/dL (ref 30.0–36.0)

## 2012-06-20 LAB — SURGICAL PCR SCREEN: Staphylococcus aureus: POSITIVE — AB

## 2012-06-20 NOTE — Progress Notes (Signed)
Faxed PCR results to Dr Laverle Patter through [facility]

## 2012-06-20 NOTE — Progress Notes (Signed)
Instructed patient to call [facility] regarding if her ASPIRIN needs to be held pre op

## 2012-06-20 NOTE — Progress Notes (Signed)
Faxed abnormal BMET to Dr Laverle Patter through [facility]

## 2012-07-04 ENCOUNTER — Encounter (HOSPITAL_COMMUNITY): Payer: Self-pay | Admitting: *Deleted

## 2012-07-04 NOTE — Progress Notes (Signed)
Chest x ray, ekg 2/14 EPIC

## 2012-07-13 ENCOUNTER — Other Ambulatory Visit: Payer: Self-pay | Admitting: Urology

## 2012-07-13 NOTE — H&P (Signed)
  History of Present Illness  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  She is s/p repair by Dr. Carolynne Edouard and myself on 05/20/10.  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  Sep 2010: TURBT at [facility] (low grade, Ta) Jul 2011: TURBT by Dr. Merry Lofty (low grade Ta) Jan 2013: TURBT (low grade Ta, multiple tumors)  Last upper tract imaging: Jan 2013 ([facility])   She was noted to have 3 recurrent bladder tumors during cystoscopy in the office in [place] 2014.  Past Medical History Problems  1. History of  Bladder Cancer V10.51 2. History of  Diabetes Mellitus 250.00 3. History of  Diverticulitis Of Colon 562.11 4. History of  Hypertension 401.9 5. History of  Murmurs 785.2  Surgical History Problems  1. History of  Closure Of Enterovesical Fistula 2. History of  Cystoscopy With Biopsy 3. History of  Cystoscopy With Fulguration Small Lesion (5-55mm) 4. History of  Cystoscopy With Insertion Of Ureteral Stent Left 5. History of  Partial Colectomy 6. History of  Tubal Ligation V25.2 7. History of  Vesicoureteral Reimplantation  Current Meds 1. Aspirin 325 MG Oral Tablet; Therapy: (Recorded:11May2012) to 2. Atenolol 25 MG Oral Tablet; Therapy: 28Feb2013 to 3. ClonazePAM 1 MG Oral Tablet; Therapy: 28Feb2013 to 4. Escitalopram Oxalate 5 MG Oral Tablet; Therapy: 28Feb2013 to 5. GlipiZIDE 10 MG Oral Tablet; Therapy: 05Feb2013 to 6. Lisinopril-Hydrochlorothiazide 20-12.5 MG Oral Tablet; Therapy: 05Mar2013 to 7. Oxycodone-Acetaminophen 5-325 MG Oral Tablet; has 3 tablets left as of 06/09/11; Therapy:  (Recorded:29Jan2013) to 8. Pravastatin Sodium 20 MG Oral Tablet; Therapy: (Recorded:23Aug2011) to  Allergies Medication  1. No Known Drug  Allergies  Family History Problems  1. Paternal history of  Heart Disease V17.49 Denied  2. Family history of  Bladder Cancer 3. Family history of  Kidney Cancer  Social History Problems  1. Marital History - Single 2. Occupation: disabled 3. Tobacco Use V15.82 smokes 1/2 pack dailysmoking for 40 years Denied  4. History of  Alcohol Use  Vitals  BMI Calculated: 33.38 BSA Calculated: 1.74 Height: 5 ft  Weight: 170 lb    Physical Exam Constitutional: Well nourished and well developed . No acute distress.  Pulmonary: No respiratory distress, normal respiratory rhythm and effort and clear bilateral breath sounds.  Cardiovascular: Heart rate and rhythm are normal . No peripheral edema.    Assessment Assessed  1. Bladder Cancer 188.9    Discussion/Summary  1. Urothelial carcinoma of the bladder: She currently appears to have at least 3 small papillary tumors. We reviewed options including the possibility of proceeding with office fulguration versus transurethral resection of these tumors in the operating room. She strongly wishes to proceed with therapy under anesthesia. She will undergo cystoscopy, bilateral retrograde pyelography, and transurethral resection of her bladder tumors. We have reviewed the potential risks and complications including but not limited to bleeding, infection, recurrence of bladder cancer, possible need for further procedures, and bladder perforation, etc. She gives her informed consent to proceed.

## 2012-07-14 ENCOUNTER — Ambulatory Visit (HOSPITAL_COMMUNITY): Payer: Medicare Other | Admitting: Anesthesiology

## 2012-07-14 ENCOUNTER — Encounter (HOSPITAL_COMMUNITY): Payer: Self-pay | Admitting: Anesthesiology

## 2012-07-14 ENCOUNTER — Encounter (HOSPITAL_COMMUNITY): Payer: Self-pay | Admitting: *Deleted

## 2012-07-14 ENCOUNTER — Encounter (HOSPITAL_COMMUNITY)
Admission: RE | Disposition: A | Discharge status: Home / Self Care | Payer: Self-pay | Source: Ambulatory Visit | Attending: Urology

## 2012-07-14 ENCOUNTER — Ambulatory Visit (HOSPITAL_COMMUNITY)
Admission: RE | Admit: 2012-07-14 | Discharge: 2012-07-14 | Disposition: A | Discharge status: Home / Self Care | Payer: Medicare Other | Source: Ambulatory Visit | Attending: Urology | Admitting: Urology

## 2012-07-14 DIAGNOSIS — F411 Generalized anxiety disorder: Secondary | ICD-10-CM | POA: Insufficient documentation

## 2012-07-14 DIAGNOSIS — Z79899 Other long term (current) drug therapy: Secondary | ICD-10-CM | POA: Insufficient documentation

## 2012-07-14 DIAGNOSIS — F17200 Nicotine dependence, unspecified, uncomplicated: Secondary | ICD-10-CM | POA: Insufficient documentation

## 2012-07-14 DIAGNOSIS — Z7982 Long term (current) use of aspirin: Secondary | ICD-10-CM | POA: Insufficient documentation

## 2012-07-14 DIAGNOSIS — C679 Malignant neoplasm of bladder, unspecified: Secondary | ICD-10-CM | POA: Insufficient documentation

## 2012-07-14 DIAGNOSIS — I10 Essential (primary) hypertension: Secondary | ICD-10-CM | POA: Insufficient documentation

## 2012-07-14 DIAGNOSIS — E119 Type 2 diabetes mellitus without complications: Secondary | ICD-10-CM | POA: Insufficient documentation

## 2012-07-14 HISTORY — PX: TRANSURETHRAL RESECTION OF BLADDER TUMOR: SHX2575

## 2012-07-14 HISTORY — PX: CYSTOSCOPY W/ RETROGRADES: SHX1426

## 2012-07-14 SURGERY — TURBT (TRANSURETHRAL RESECTION OF BLADDER TUMOR)
Anesthesia: General | Wound class: Clean Contaminated

## 2012-07-14 MED ORDER — LACTATED RINGERS IV SOLN
INTRAVENOUS | Status: DC | PRN
  Administered 2012-07-14: 12:00:00 via INTRAVENOUS

## 2012-07-14 MED ORDER — ACETAMINOPHEN 10 MG/ML IV SOLN
INTRAVENOUS | Status: AC
  Filled 2012-07-14: qty 100

## 2012-07-14 MED ORDER — FENTANYL CITRATE 0.05 MG/ML IJ SOLN: 25.0000 ug | INTRAMUSCULAR | Status: DC | PRN

## 2012-07-14 MED ORDER — IOHEXOL 300 MG/ML  SOLN
INTRAMUSCULAR | Status: AC
  Filled 2012-07-14: qty 1

## 2012-07-14 MED ORDER — STERILE WATER FOR IRRIGATION IR SOLN
Status: DC | PRN
  Administered 2012-07-14: 2000 mL via INTRAVESICAL

## 2012-07-14 MED ORDER — LIDOCAINE HCL 1 % IJ SOLN
INTRAMUSCULAR | Status: DC | PRN
  Administered 2012-07-14: 50 mg via INTRADERMAL

## 2012-07-14 MED ORDER — EPHEDRINE SULFATE 50 MG/ML IJ SOLN
INTRAMUSCULAR | Status: DC | PRN
  Administered 2012-07-14 (×2): 5 mg via INTRAVENOUS

## 2012-07-14 MED ORDER — PHENAZOPYRIDINE HCL 100 MG PO TABS: 100.0000 mg | ORAL_TABLET | Freq: Three times a day (TID) | ORAL | Status: DC | PRN

## 2012-07-14 MED ORDER — IOHEXOL 300 MG/ML  SOLN
INTRAMUSCULAR | Status: DC | PRN
  Administered 2012-07-14: 20 mL

## 2012-07-14 MED ORDER — OXYCODONE HCL 5 MG PO TABS: 5.0000 mg | ORAL_TABLET | ORAL | Status: DC | PRN

## 2012-07-14 MED ORDER — CIPROFLOXACIN IN D5W 400 MG/200ML IV SOLN
400.0000 mg | INTRAVENOUS | Status: AC
  Administered 2012-07-14: 400 mg via INTRAVENOUS

## 2012-07-14 MED ORDER — SODIUM CHLORIDE 0.9 % IR SOLN
Status: DC | PRN
  Administered 2012-07-14: 6000 mL via INTRAVESICAL

## 2012-07-14 MED ORDER — CIPROFLOXACIN IN D5W 400 MG/200ML IV SOLN
INTRAVENOUS | Status: AC
  Filled 2012-07-14: qty 200

## 2012-07-14 MED ORDER — PROPOFOL 10 MG/ML IV EMUL
INTRAVENOUS | Status: DC | PRN
  Administered 2012-07-14: 150 mg via INTRAVENOUS

## 2012-07-14 MED ORDER — ACETAMINOPHEN 10 MG/ML IV SOLN
INTRAVENOUS | Status: DC | PRN
  Administered 2012-07-14: 1000 mg via INTRAVENOUS

## 2012-07-14 MED ORDER — PROMETHAZINE HCL 25 MG/ML IJ SOLN: 6.2500 mg | INTRAMUSCULAR | Status: DC | PRN

## 2012-07-14 MED ORDER — PHENYLEPHRINE HCL 10 MG/ML IJ SOLN
INTRAMUSCULAR | Status: DC | PRN
  Administered 2012-07-14 (×3): 40 ug via INTRAVENOUS

## 2012-07-14 MED ORDER — FENTANYL CITRATE 0.05 MG/ML IJ SOLN
INTRAMUSCULAR | Status: DC | PRN
  Administered 2012-07-14: 50 ug via INTRAVENOUS

## 2012-07-14 MED ORDER — MIDAZOLAM HCL 5 MG/5ML IJ SOLN
INTRAMUSCULAR | Status: DC | PRN
  Administered 2012-07-14: 2 mg via INTRAVENOUS

## 2012-07-14 MED ORDER — ONDANSETRON HCL 4 MG/2ML IJ SOLN
INTRAMUSCULAR | Status: DC | PRN
  Administered 2012-07-14: 4 mg via INTRAVENOUS

## 2012-07-14 SURGICAL SUPPLY — 21 items
ADAPTER CATH URET PLST 4-6FR (CATHETERS) IMPLANT
BAG URINE DRAINAGE (UROLOGICAL SUPPLIES) IMPLANT
BAG URO CATCHER STRL LF (DRAPE) ×3 IMPLANT
CATH INTERMIT  6FR 70CM (CATHETERS) IMPLANT
CATH URET 5FR 28IN CONE TIP (BALLOONS) ×1
CATH URET 5FR 70CM CONE TIP (BALLOONS) ×2 IMPLANT
CLOTH BEACON ORANGE TIMEOUT ST (SAFETY) ×3 IMPLANT
DRAPE CAMERA CLOSED 9X96 (DRAPES) ×3 IMPLANT
ELECT REM PT RETURN 9FT ADLT (ELECTROSURGICAL) ×3
ELECTRODE REM PT RTRN 9FT ADLT (ELECTROSURGICAL) ×2 IMPLANT
EVACUATOR MICROVAS BLADDER (UROLOGICAL SUPPLIES) IMPLANT
GLOVE BIOGEL M STRL SZ7.5 (GLOVE) ×3 IMPLANT
GOWN STRL NON-REIN LRG LVL3 (GOWN DISPOSABLE) ×3 IMPLANT
GUIDEWIRE STR DUAL SENSOR (WIRE) IMPLANT
KIT ASPIRATION TUBING (SET/KITS/TRAYS/PACK) IMPLANT
LOOPS RESECTOSCOPE DISP (ELECTROSURGICAL) IMPLANT
MANIFOLD NEPTUNE II (INSTRUMENTS) ×3 IMPLANT
NS IRRIG 1000ML POUR BTL (IV SOLUTION) IMPLANT
PACK CYSTO (CUSTOM PROCEDURE TRAY) ×3 IMPLANT
SYRINGE IRR TOOMEY STRL 70CC (SYRINGE) IMPLANT
TUBING CONNECTING 10 (TUBING) ×3 IMPLANT

## 2012-07-14 NOTE — Anesthesia Postprocedure Evaluation (Signed)
  Anesthesia Post-op Note  Patient: Jill Fox  Procedure(s) Performed: Procedure(s) (LRB): TRANSURETHRAL RESECTION OF BLADDER TUMOR (TURBT) (N/A) CYSTOSCOPY WITH RETROGRADE PYELOGRAM (Bilateral)  Patient Location: PACU  Anesthesia Type: General  Level of Consciousness: awake and alert   Airway and Oxygen Therapy: Patient Spontanous Breathing  Post-op Pain: mild  Post-op Assessment: Post-op Vital signs reviewed, Patient's Cardiovascular Status Stable, Respiratory Function Stable, Patent Airway and No signs of Nausea or vomiting  Last Vitals:  Filed Vitals:   07/14/12 1427  BP: 103/70  Pulse: 67  Temp: 36.3 C  Resp: 16    Post-op Vital Signs: stable   Complications: No apparent anesthesia complications

## 2012-07-14 NOTE — Preoperative (Signed)
Beta Blockers   Reason not to administer Beta Blockers:Not Applicable 

## 2012-07-14 NOTE — Transfer of Care (Signed)
Immediate Anesthesia Transfer of Care Note  Patient: Jill Fox  Procedure(s) Performed: Procedure(s) with comments: TRANSURETHRAL RESECTION OF BLADDER TUMOR (TURBT) (N/A) - with bladder fulguration CYSTOSCOPY WITH RETROGRADE PYELOGRAM (Bilateral)  Patient Location: PACU  Anesthesia Type:General  Level of Consciousness: awake, alert , oriented, patient cooperative and responds to stimulation  Airway & Oxygen Therapy: Patient Spontanous Breathing and Patient connected to face mask oxygen  Post-op Assessment: Report given to PACU RN, Post -op Vital signs reviewed and stable and Patient moving all extremities X 4  Post vital signs: Reviewed and stable  Complications: No apparent anesthesia complications

## 2012-07-14 NOTE — Op Note (Signed)
Preoperative diagnosis: 1. Bladder tumor (1 cm) 2. History of bladder cancer  Postoperative diagnosis:  1. Bladder tumor (1 cm) 2. History of bladder cancer  Procedure:  1. Cystoscopy 2. Transurethral resection of bladder tumor (1 cm) 3. Bilateral retrograde pyelography with interpretation  Surgeon: Moody Bruins. M.D.  Anesthesia: General  Complications: None  Intraoperative findings:  1. Bladder tumor: There were 4 bladder tumors measuring 1 cm or less throughout the bladder. There was a left lateral bladder tumor, right lateral bladder tumor, supratrigonal bladder tumor, and posterior bladder tumor. These all appeared papillary and low grade. 2. Retrograde pyelography: The renal collecting systems and ureters were normal bilaterally without dilation or filling defects.  EBL: Minimal  Specimens: 1. Left lateral bladder tumor 2. Posterior bladder tumor 3. Super trigonal bladder tumor 4. Right lateral bladder tumor  Disposition of specimens: Pathology  Indication: Jill Fox is a patient who was found to have a bladder tumor. After reviewing the management options for treatment, he elected to proceed with the above surgical procedure(s). We have discussed the potential benefits and risks of the procedure, side effects of the proposed treatment, the likelihood of the patient achieving the goals of the procedure, and any potential problems that might occur during the procedure or recuperation. Informed consent has been obtained.  Description of procedure:  The patient was taken to the operating room and general anesthesia was induced.  The patient was placed in the dorsal lithotomy position, prepped and draped in the usual sterile fashion, and preoperative antibiotics were administered. A preoperative time-out was performed.   Cystourethroscopy was performed.  The patient's urethra was examined and was normal.   The bladder was then systematically examined in its  entirety. The right ureteral orifice was in its expected position and effluxing clear urine. The left ureteral orifice was identified and was also in its expected position. The reimplanted left ureter was also identified. There were 4 bladder tumors identified which all appeared papillary and low-grade. They all measured 1 cm or less in size. Their locations are as described above.  Attention then turned to the right ureteral orifice and a ureteral catheter was used to intubate the ureteral orifice.  Omnipaque contrast was injected through the ureteral catheter and a retrograde pyelogram was performed with findings as dictated above.  Attention then turned to the left ureteral orifice and a ureteral catheter was used to intubate the ureteral orifice.  Omnipaque contrast was injected through the ureteral catheter and a retrograde pyelogram was performed with findings as dictated above.  Attention then returned to the bladder. Each bladder tumor appeared relatively small it was felt that it could be resected with the cold cup forceps. Therefore each tumor was resected with the cold cup forceps in its entirety. The Bugbee electrode was then used to achieve hemostasis. The bladder was emptied and refilled and hemostasis appeared excellent. Considering the patient's history of low-grade urothelial carcinoma and the low-grade appearance of these tumors, additional deeper biopsies were not performed. In addition, mitomycin-C was not administered due to her refluxing left ureter.  The bladder was then emptied and the procedure ended.  The patient appeared to tolerate the procedure well and without complications.  The patient was able to be awakened and transferred to the recovery unit in satisfactory condition.    Moody Bruins MD

## 2012-07-14 NOTE — Anesthesia Preprocedure Evaluation (Addendum)
Anesthesia Evaluation  Patient identified by MRN, date of birth, ID band Patient awake    Reviewed: Allergy & Precautions, H&P , NPO status , Patient's Chart, lab work & pertinent test results  Airway Mallampati: II TM Distance: >3 FB Neck ROM: Full    Dental no notable dental hx.    Pulmonary neg pulmonary ROS,  breath sounds clear to auscultation  Pulmonary exam normal       Cardiovascular hypertension, Pt. on home beta blockers and Pt. on medications Rhythm:Regular Rate:Normal     Neuro/Psych Anxiety negative neurological ROS     GI/Hepatic negative GI ROS, Neg liver ROS,   Endo/Other  diabetes, Type 2, Oral Hypoglycemic Agents  Renal/GU Renal disease  negative genitourinary   Musculoskeletal negative musculoskeletal ROS (+)   Abdominal   Peds negative pediatric ROS (+)  Hematology negative hematology ROS (+)   Anesthesia Other Findings   Reproductive/Obstetrics negative OB ROS                           Anesthesia Physical Anesthesia Plan  ASA: III  Anesthesia Plan: General   Post-op Pain Management:    Induction: Intravenous  Airway Management Planned: LMA  Additional Equipment:   Intra-op Plan:   Post-operative Plan: Extubation in OR  Informed Consent: I have reviewed the patients History and Physical, chart, labs and discussed the procedure including the risks, benefits and alternatives for the proposed anesthesia with the patient or authorized representative who has indicated his/her understanding and acceptance.   Dental advisory given  Plan Discussed with: CRNA  Anesthesia Plan Comments:         Anesthesia Quick Evaluation

## 2012-07-18 ENCOUNTER — Encounter (HOSPITAL_COMMUNITY): Payer: Self-pay | Admitting: Urology

## 2014-07-30 ENCOUNTER — Other Ambulatory Visit: Payer: Self-pay | Admitting: Urology

## 2014-08-22 ENCOUNTER — Ambulatory Visit (HOSPITAL_COMMUNITY)
Admission: RE | Admit: 2014-08-22 | Discharge: 2014-08-22 | Disposition: A | Discharge status: Home / Self Care | Payer: Medicare Other | Source: Ambulatory Visit | Attending: Urology | Admitting: Urology

## 2014-08-22 ENCOUNTER — Encounter (HOSPITAL_COMMUNITY)
Admission: RE | Admit: 2014-08-22 | Discharge: 2014-08-22 | Disposition: A | Discharge status: Home / Self Care | Payer: Medicare Other | Source: Ambulatory Visit | Attending: Urology | Admitting: Urology

## 2014-08-22 ENCOUNTER — Encounter (HOSPITAL_COMMUNITY): Payer: Self-pay

## 2014-08-22 DIAGNOSIS — Z01810 Encounter for preprocedural cardiovascular examination: Secondary | ICD-10-CM

## 2014-08-22 DIAGNOSIS — D494 Neoplasm of unspecified behavior of bladder: Secondary | ICD-10-CM | POA: Diagnosis not present

## 2014-08-22 DIAGNOSIS — Z87891 Personal history of nicotine dependence: Secondary | ICD-10-CM | POA: Insufficient documentation

## 2014-08-22 HISTORY — DX: Other female intestinal-genital tract fistulae: N82.4

## 2014-08-22 LAB — BASIC METABOLIC PANEL
Anion gap: 7 (ref 5–15)
BUN: 25 mg/dL — AB (ref 6–23)
CALCIUM: 9.1 mg/dL (ref 8.4–10.5)
CO2: 26 mmol/L (ref 19–32)
Chloride: 105 mmol/L (ref 96–112)
Creatinine, Ser: 0.98 mg/dL (ref 0.50–1.10)
GFR calc Af Amer: 69 mL/min — ABNORMAL LOW (ref >90)
GFR, EST NON AFRICAN AMERICAN: 60 mL/min — AB (ref >90)
GLUCOSE: 171 mg/dL — AB (ref 70–99)
Potassium: 4.4 mmol/L (ref 3.5–5.1)
Sodium: 138 mmol/L (ref 135–145)

## 2014-08-22 LAB — SURGICAL PCR SCREEN
MRSA, PCR: NEGATIVE
STAPHYLOCOCCUS AUREUS: NEGATIVE

## 2014-08-22 NOTE — Patient Instructions (Signed)
[place]  08/22/2014   Your procedure is scheduled on:     Monday August 27, 2014  Report to [facility] Main Entr[facility] and follow signs to  [place] arrive at 12:30 PM.  Call this number if you have problems the morning of surgery [phone] or Presurgical Testing [phone].   Remember:  Do not eat food After Midnight but may take clear liquids till 9:00 am day of surgery then nothing by mouth.        Take these medicines the morning of surgery with A SIP OF WATER: Alprazolam (Xanax) if needed; Atenolol (Tenormin); Oxycodone if needed                               You may not have any metal on your body including hair pins and piercings  Do not wear jewelry, make-up, lotions, powders, perfumes, nail polish or deodorant.  Do not shave body hair  48 hours(2 days) of CHG soap use.                Do not bring valuables to the hospital. [place].  Contacts, dentures or bridgework may not be worn into surgery.    Patients discharged the day of surgery will not be allowed to drive home.  Name and phone number of your driver:Jill Fox (friend)   ________________________________________________________________________  [facility] - Preparing for Surgery Before surgery, you can play an important role.  Because skin is not sterile, your skin needs to be as free of germs as possible.  You can reduce the number of germs on your skin by washing with CHG (chlorahexidine gluconate) soap before surgery.  CHG is an antiseptic cleaner which kills germs and bonds with the skin to continue killing germs even after washing. Please DO NOT use if you have an allergy to CHG or antibacterial soaps.  If your skin becomes reddened/irritated stop using the CHG and inform your nurse when you arrive at Short Stay. Do not shave (including legs and underarms) for at least 48 hours prior to the first CHG shower.  You may shave your  face/neck. Please follow these instructions carefully:  1.  Shower with CHG Soap the night before surgery and the  morning of Surgery.  2.  If you choose to wash your hair, wash your hair first as usual with your  normal  shampoo.  3.  After you shampoo, rinse your hair and body thoroughly to remove the  shampoo.                           4.  Use CHG as you would any other liquid soap.  You can apply chg directly  to the skin and wash                       Gently with a scrungie or clean washcloth.  5.  Apply the CHG Soap to your body ONLY FROM THE NECK DOWN.   Do not use on face/ open                           Wound or open sores. Avoid contact with eyes, ears mouth and genitals (private parts).  Wash face,  Genitals (private parts) with your normal soap.             6.  Wash thoroughly, paying special attention to the area where your surgery  will be performed.  7.  Thoroughly rinse your body with warm water from the neck down.  8.  DO NOT shower/wash with your normal soap after using and rinsing off  the CHG Soap.                9.  Pat yourself dry with a clean towel.            10.  Wear clean pajamas.            11.  Place clean sheets on your bed the night of your first shower and do not  sleep with pets. Day of Surgery : Do not apply any lotions/deodorants the morning of surgery.  Please wear clean clothes to the hospital/surgery center.  FAILURE TO FOLLOW THESE INSTRUCTIONS MAY RESULT IN THE CANCELLATION OF YOUR SURGERY PATIENT SIGNATURE_________________________________  NURSE SIGNATURE__________________________________  ________________________________________________________________________    CLEAR LIQUID DIET   Foods Allowed                                                                     Foods Excluded  Coffee and tea, regular and decaf                             liquids that you cannot  Plain Jell-O in any flavor                                              see through such as: Fruit ices (not with fruit pulp)                                     milk, soups, orange juice  Iced Popsicles                                    All solid food Carbonated beverages, regular and diet                                    Cranberry, grape and apple juices Sports drinks like Gatorade Lightly seasoned clear broth or consume(fat free) Sugar, honey syrup  Sample Menu Breakfast                                Lunch                                     Supper Cranberry juice  Beef broth                            Chicken broth Jell-O                                     Grape juice                           Apple juice Coffee or tea                        Jell-O                                      Popsicle                                                Coffee or tea                        Coffee or tea  _____________________________________________________________________

## 2014-08-22 NOTE — Progress Notes (Deleted)
H&P per chart per [facility] 07/04/2014 and 08/09/2014  EKG per chart 08/10/2014  CBC per chart 08/10/2014

## 2014-08-22 NOTE — Progress Notes (Signed)
CBCD,thyroid panel with TSH,CMP,Lipid panel,HgbA1C,Vitamin D results per chart 08/16/2014

## 2014-08-22 NOTE — Progress Notes (Addendum)
BMP and CXR in epic per PAT visit 08/22/2014 sent to Dr Alinda Money

## 2014-08-24 NOTE — H&P (Signed)
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 She is s/p repair by Dr. Marlou Starks and myself on 05/20/10.    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    Sep 2010: TURBT at [facility] (low grade, Ta)  Jul 2011: TURBT by Dr. Burnell Blanks (low grade Ta)  Jan 2013: TURBT (low grade Ta, multiple tumors)  Mar 2014: TURBT (low grade Ta, multiple tumors)    Last upper tract imaging: Mar 2014 (RPGs)     Interval history:    She follows up today for cystoscopic surveillance of her bladder cancer. She was noted to have 2 small papillary tumors at her last cystoscopic evaluation that we elected to monitor. She has denied any hematuria or change in her voiding habits. She remains in stable overall health.   Past Medical History Problems  1. History of diabetes mellitus (Z86.39) 2. History of diverticulitis of colon (Z87.19) 3. History of hypertension (Z86.79) 4. History of Murmur (R01.1) 5. Personal history of bladder cancer (Z85.51)  Surgical History Problems  1. History of Closure Of Enterovesical Fistula 2. History of Cystoscopy With Biopsy 3. History of Cystoscopy With Fulguration Small Lesion (5-49mm) 4. History of Cystoscopy With Fulguration Small Lesion (5-68mm) 5. History of Cystoscopy With Insertion Of Ureteral Stent Left 6. History of Partial Colectomy 7. History of Tubal Ligation 8. History of Vesicoureteral Reimplantation  Current Meds 1. ALPRAZolam 0.5 MG Oral Tablet;  Therapy: [id number] to Recorded 2. Aspirin 325 MG Oral Tablet;  Therapy: (Recorded:11May2012) to Recorded 3. Atenolol 25 MG Oral Tablet;  Therapy: 28Feb2013 to Recorded 4. ClonazePAM 1 MG Oral Tablet;  Therapy: 28Feb2013 to Recorded 5.  Escitalopram Oxalate 5 MG Oral Tablet;  Therapy: 28Feb2013 to Recorded 6. GlipiZIDE 10 MG Oral Tablet;  Therapy: 50YDX4128 to Recorded 7. Ibuprofen 600 MG Oral Tablet;  Therapy: 03Sep2015 to Recorded 8. Ketoconazole 2 % External Shampoo;  Therapy: 78MVE7209 to Recorded 9. Lisinopril-Hydrochlorothiazide 20-12.5 MG Oral Tablet;  Therapy: 47SJG2836 to Recorded 10. Lisinopril-Hydrochlorothiazide 20-25 MG Oral Tablet;   Therapy: 62HUT6546 to Recorded 11. Phenazopyridine HCl - 100 MG Oral Tablet;   Therapy: 50PTW6568 to Recorded 12. Pravastatin Sodium 20 MG Oral Tablet;   Therapy: (Recorded:23Aug2011) to Recorded 13. Triamcinolone Acetonide 0.1 % External Cream;   Therapy: 08Aug2014 to Recorded  Allergies Medication  1. No Known Drug Allergies  Family History Problems  1. Denied: Family history of Bladder Cancer 2. Family history of Heart Disease : Father 3. Denied: Family history of Kidney Cancer  Social History Problems  1. Denied: History of Alcohol Use 2. Current every day smoker (F17.200) 3. Marital History - Single 4. Occupation:   disabled 84. Tobacco Use   smokes 1/2 pack dailysmoking for 40 years  Review of Systems  Genitourinary: no hematuria.  Cardiovascular: no chest pain.  Respiratory: no shortness of breath.    Vitals Vital Signs [Data Includes: Last 1 Day]  Recorded: 12XNT7001 02:44PM  Weight: 174 lb  BMI Calculated: 33.98 BSA Calculated: 1.76 Blood Pressure: 123 / 75 Heart Rate: 90  Physical Exam Constitutional: Well nourished and well developed . No acute distress.  ENT:. The ears and nose are normal in appearance.  Neck: The appearance of the neck is normal and no neck mass is present.  Pulmonary: No  respiratory distress, normal respiratory rhythm and effort and clear bilateral breath sounds.  Cardiovascular: Heart rate and rhythm are normal . No peripheral edema.  Abdomen: The abdomen is soft and nontender. No masses are palpated. No CVA  tenderness. No hernias are palpable. No hepatosplenomegaly noted.  Genitourinary:   Examination of the external genitalia shows normal female external genitalia.  Lymphatics: The femoral and inguinal nodes are not enlarged or tender.  Skin: Normal skin turgor, no visible rash and no visible skin lesions.  Neuro/Psych:. Mood and affect are appropriate.    Results/Data Urine [Data Includes: Last 1 Day]   [id number]  COLOR YELLOW   APPEARANCE CLEAR   SPECIFIC GRAVITY 1.020   pH 5.0   GLUCOSE NEG mg/dL  BILIRUBIN NEG   KETONE NEG mg/dL  BLOOD NEG   PROTEIN NEG mg/dL  UROBILINOGEN 0.2 mg/dL  NITRITE NEG   LEUKOCYTE ESTERASE NEG    Procedure  Procedure: Cystoscopy  Chaperone Present: Heather S.  Indication: History of Urothelial Carcinoma.  Informed Consent: Risks, benefits, and potential adverse events were discussed and informed consent was obtained from the patient.  Prep: The patient was prepped with betadine.  Anesthesia:. Local anesthesia was administered intraurethrally with 2% lidocaine jelly.  Antibiotic prophylaxis: Ciprofloxacin.  Procedure Note:  Urethral meatus:. No abnormalities.  Anterior urethra: No abnormalities.  Bladder: The left ureteral orifice is displaced consistent with her prior ureteral reimplantation. It is refluxing clear urine. Visulization was clear. The right ureteral orifice was in the normal anatomic position and had clear efflux of urine. Systematic evaluation of the bladder demonstrates 2 stable small papillary tumors near the right hemitrigone. There is a new small flat lesion on the right lateral bladder wall that is mildly suspicious for malignancy. Finally, there is a new papillary tumor just adjacent to the reimplanted left ureteral orifice. The majority of her tumors appeared low-grade consistent with her history. A saline bladder washing was obtained and sent for cytologic analysis. The patient tolerated the procedure well.  Complications: None.     Assessment Assessed  1. Malignant neoplasm of other specified sites of bladder (C67.9)  Plan Health Maintenance  1. UA With REFLEX; [Do Not Release]; Status:Complete;   Done: 62XBM8413 02:35PM Malignant neoplasm of other specified sites of bladder  2. Follow-up Office  Follow-up - will call to schedule surgery  Status: Hold For -  Appointment,Date of Service  Requested for: [id number] 3. URINE CYTOLOGY; Status:In Progress - Specimen/Data Collected;   Done: 53GUY4034  Discussion/Summary 1. Bladder cancer with recurrent bladder tumors: I did recommend that we proceed with cystoscopy under anesthesia with transurethral resection of her bladder tumors. I will plan to perform bilateral retrograde pyelography at that time for upper tract evaluation. We again discussed the option of intravesical therapy including mitomycin postoperatively and the option of BCG subsequently. Although she has had recurrent tumors, we have wish to hold off on intravesical therapy considering her refluxing reimplanted left ureter. We have reviewed the potential risks, complications, and expected recovery process associated with this procedure. She gives her consent to proceed.     Verified Results URINE CYTOLOGY1 [id number] 03:11PM1 Read Drivers  SPECIMEN TYPE: OTHER   Test Name Result Flag Reference  FINAL DIAGNOSIS:1     - NO MALIGNANT CELLS IDENTIFIED. - BENIGN REACTIVE UROTHELIAL CELLS ARE PRESENT.  SOURCE:1 Bladder Washing1    100CC OF CLEAR BLW RECEIVED 1 SLIDE PREPARED [id number] EM  Relevant Clinical Info1     MALIGNANT NEOPLASM OF OTHER SPECIFIED SITES OF  BLADDER  PATHOLOGIST:1     REVIEWED BY VALERIE J. Depaolo, MD, FCAP (ELECTRONIC SIGNATURE ON FILE)  NUMBER OF SLIDES1     1 Container Submitted  CYTOTECHNOLOGIST:1     LJ[facility], BS CT(ASCP)     1. Amended By: Raynelle Bring; Jul 30 2014 5:45 PM EST  Signatures Electronically signed by : Raynelle Bring, M.D.; Jul 30 2014  5:45PM EST

## 2014-08-27 ENCOUNTER — Ambulatory Visit (HOSPITAL_COMMUNITY): Payer: Medicare Other | Admitting: Anesthesiology

## 2014-08-27 ENCOUNTER — Encounter (HOSPITAL_COMMUNITY): Payer: Self-pay | Admitting: Anesthesiology

## 2014-08-27 ENCOUNTER — Encounter (HOSPITAL_COMMUNITY)
Admission: RE | Disposition: A | Discharge status: Home / Self Care | Payer: Self-pay | Source: Ambulatory Visit | Attending: Urology

## 2014-08-27 ENCOUNTER — Ambulatory Visit (HOSPITAL_COMMUNITY)
Admission: RE | Admit: 2014-08-27 | Discharge: 2014-08-27 | Disposition: A | Discharge status: Home / Self Care | Payer: Medicare Other | Source: Ambulatory Visit | Attending: Urology | Admitting: Urology

## 2014-08-27 DIAGNOSIS — Z9851 Tubal ligation status: Secondary | ICD-10-CM | POA: Diagnosis not present

## 2014-08-27 DIAGNOSIS — F17210 Nicotine dependence, cigarettes, uncomplicated: Secondary | ICD-10-CM | POA: Insufficient documentation

## 2014-08-27 DIAGNOSIS — I10 Essential (primary) hypertension: Secondary | ICD-10-CM | POA: Diagnosis not present

## 2014-08-27 DIAGNOSIS — Z7982 Long term (current) use of aspirin: Secondary | ICD-10-CM | POA: Insufficient documentation

## 2014-08-27 DIAGNOSIS — Z9049 Acquired absence of other specified parts of digestive tract: Secondary | ICD-10-CM | POA: Insufficient documentation

## 2014-08-27 DIAGNOSIS — E119 Type 2 diabetes mellitus without complications: Secondary | ICD-10-CM | POA: Diagnosis not present

## 2014-08-27 DIAGNOSIS — C679 Malignant neoplasm of bladder, unspecified: Secondary | ICD-10-CM | POA: Insufficient documentation

## 2014-08-27 HISTORY — PX: CYSTOSCOPY W/ RETROGRADES: SHX1426

## 2014-08-27 HISTORY — PX: TRANSURETHRAL RESECTION OF BLADDER TUMOR: SHX2575

## 2014-08-27 LAB — GLUCOSE, CAPILLARY: GLUCOSE-CAPILLARY: 154 mg/dL — AB (ref 70–99)

## 2014-08-27 SURGERY — TURBT (TRANSURETHRAL RESECTION OF BLADDER TUMOR)
Anesthesia: General

## 2014-08-27 MED ORDER — IOHEXOL 300 MG/ML  SOLN
INTRAMUSCULAR | Status: DC | PRN
  Administered 2014-08-27: 10 mL

## 2014-08-27 MED ORDER — MEPERIDINE HCL 50 MG/ML IJ SOLN: 6.2500 mg | INTRAMUSCULAR | Status: DC | PRN

## 2014-08-27 MED ORDER — FENTANYL CITRATE (PF) 100 MCG/2ML IJ SOLN
INTRAMUSCULAR | Status: AC
  Filled 2014-08-27: qty 2

## 2014-08-27 MED ORDER — PHENYLEPHRINE HCL 10 MG/ML IJ SOLN
INTRAMUSCULAR | Status: DC | PRN
  Administered 2014-08-27: 80 ug via INTRAVENOUS
  Administered 2014-08-27 (×2): 40 ug via INTRAVENOUS
  Administered 2014-08-27: 120 ug via INTRAVENOUS
  Administered 2014-08-27: 40 ug via INTRAVENOUS

## 2014-08-27 MED ORDER — ONDANSETRON HCL 4 MG/2ML IJ SOLN: 4.0000 mg | Freq: Once | INTRAMUSCULAR | Status: DC | PRN

## 2014-08-27 MED ORDER — CIPROFLOXACIN IN D5W 400 MG/200ML IV SOLN
400.0000 mg | INTRAVENOUS | Status: AC
  Administered 2014-08-27: 400 mg via INTRAVENOUS

## 2014-08-27 MED ORDER — HYDROMORPHONE HCL 1 MG/ML IJ SOLN: 0.2500 mg | INTRAMUSCULAR | Status: DC | PRN

## 2014-08-27 MED ORDER — MIDAZOLAM HCL 2 MG/2ML IJ SOLN
INTRAMUSCULAR | Status: AC
  Filled 2014-08-27: qty 2

## 2014-08-27 MED ORDER — 0.9 % SODIUM CHLORIDE (POUR BTL) OPTIME
TOPICAL | Status: DC | PRN
  Administered 2014-08-27: 1000 mL

## 2014-08-27 MED ORDER — LACTATED RINGERS IV SOLN
INTRAVENOUS | Status: DC
  Administered 2014-08-27: 1000 mL via INTRAVENOUS
  Administered 2014-08-27: 14:00:00 via INTRAVENOUS

## 2014-08-27 MED ORDER — PROPOFOL 10 MG/ML IV BOLUS
INTRAVENOUS | Status: DC | PRN
  Administered 2014-08-27: 150 mg via INTRAVENOUS

## 2014-08-27 MED ORDER — STERILE WATER FOR IRRIGATION IR SOLN
Status: DC | PRN
  Administered 2014-08-27: 3000 mL

## 2014-08-27 MED ORDER — PROPOFOL 10 MG/ML IV BOLUS
INTRAVENOUS | Status: AC
  Filled 2014-08-27: qty 20

## 2014-08-27 MED ORDER — LIDOCAINE HCL (CARDIAC) 20 MG/ML IV SOLN
INTRAVENOUS | Status: AC
  Filled 2014-08-27: qty 5

## 2014-08-27 MED ORDER — CIPROFLOXACIN IN D5W 400 MG/200ML IV SOLN
INTRAVENOUS | Status: AC
  Filled 2014-08-27: qty 200

## 2014-08-27 MED ORDER — EPHEDRINE SULFATE 50 MG/ML IJ SOLN
INTRAMUSCULAR | Status: DC | PRN
  Administered 2014-08-27 (×2): 5 mg via INTRAVENOUS

## 2014-08-27 MED ORDER — PHENAZOPYRIDINE HCL 100 MG PO TABS: 100.0000 mg | ORAL_TABLET | Freq: Three times a day (TID) | ORAL | Status: DC | PRN

## 2014-08-27 MED ORDER — OXYCODONE-ACETAMINOPHEN 5-325 MG PO TABS: 1.0000 | ORAL_TABLET | ORAL | Status: DC | PRN

## 2014-08-27 MED ORDER — MIDAZOLAM HCL 5 MG/5ML IJ SOLN
INTRAMUSCULAR | Status: DC | PRN
  Administered 2014-08-27: 2 mg via INTRAVENOUS

## 2014-08-27 MED ORDER — FENTANYL CITRATE (PF) 100 MCG/2ML IJ SOLN
INTRAMUSCULAR | Status: DC | PRN
Start: 2014-08-27 — End: 2014-08-27
  Administered 2014-08-27 (×2): 50 ug via INTRAVENOUS

## 2014-08-27 MED ORDER — HYDRALAZINE HCL 20 MG/ML IJ SOLN
INTRAMUSCULAR | Status: AC
  Filled 2014-08-27: qty 1

## 2014-08-27 MED ORDER — SODIUM CHLORIDE 0.9 % IR SOLN
Status: DC | PRN
  Administered 2014-08-27: 3000 mL

## 2014-08-27 MED ORDER — PHENYLEPHRINE 40 MCG/ML (10ML) SYRINGE FOR IV PUSH (FOR BLOOD PRESSURE SUPPORT)
PREFILLED_SYRINGE | INTRAVENOUS | Status: AC
  Filled 2014-08-27: qty 10

## 2014-08-27 SURGICAL SUPPLY — 15 items
BAG URINE DRAINAGE (UROLOGICAL SUPPLIES) IMPLANT
BAG URO CATCHER STRL LF (DRAPE) ×4 IMPLANT
CATH INTERMIT  6FR 70CM (CATHETERS) ×4 IMPLANT
ELECT REM PT RETURN 9FT ADLT (ELECTROSURGICAL) ×4
ELECTRODE REM PT RTRN 9FT ADLT (ELECTROSURGICAL) ×2 IMPLANT
EVACUATOR MICROVAS BLADDER (UROLOGICAL SUPPLIES) IMPLANT
GLOVE BIOGEL M STRL SZ7.5 (GLOVE) ×4 IMPLANT
GOWN STRL REUS W/TWL LRG LVL3 (GOWN DISPOSABLE) ×4 IMPLANT
KIT ASPIRATION TUBING (SET/KITS/TRAYS/PACK) IMPLANT
LOOP CUT BIPOLAR 24F LRG (ELECTROSURGICAL) IMPLANT
MANIFOLD NEPTUNE II (INSTRUMENTS) ×4 IMPLANT
PACK CYSTO (CUSTOM PROCEDURE TRAY) ×4 IMPLANT
SYRINGE IRR TOOMEY STRL 70CC (SYRINGE) IMPLANT
TUBING CONNECTING 10 (TUBING) ×3 IMPLANT
TUBING CONNECTING 10' (TUBING) ×1

## 2014-08-27 NOTE — Anesthesia Preprocedure Evaluation (Signed)
Anesthesia Evaluation  Patient identified by MRN, date of birth, ID band Patient awake    Reviewed: Allergy & Precautions, NPO status , Patient's Chart, lab work & pertinent test results  Airway Mallampati: I  TM Distance: >3 FB Neck ROM: Full    Dental   Pulmonary Current Smoker,    Pulmonary exam normal       Cardiovascular hypertension, Pt. on medications     Neuro/Psych    GI/Hepatic   Endo/Other  diabetes, Type 2, Oral Hypoglycemic Agents  Renal/GU      Musculoskeletal   Abdominal   Peds  Hematology   Anesthesia Other Findings   Reproductive/Obstetrics                             Anesthesia Physical Anesthesia Plan  ASA: II  Anesthesia Plan: General   Post-op Pain Management:    Induction: Intravenous  Airway Management Planned: LMA  Additional Equipment:   Intra-op Plan:   Post-operative Plan: Extubation in OR  Informed Consent: I have reviewed the patients History and Physical, chart, labs and discussed the procedure including the risks, benefits and alternatives for the proposed anesthesia with the patient or authorized representative who has indicated his/her understanding and acceptance.     Plan Discussed with: CRNA and Surgeon  Anesthesia Plan Comments:         Anesthesia Quick Evaluation

## 2014-08-27 NOTE — Anesthesia Postprocedure Evaluation (Signed)
Anesthesia Post Note  Patient: Jill Fox  Procedure(s) Performed: Procedure(s) (LRB): TRANSURETHRAL RESECTION OF BLADDER TUMOR (TURBT) (N/A) CYSTOSCOPY WITH RETROGRADE PYELOGRAM (Bilateral)  Anesthesia type: general  Patient location: PACU  Post pain: Pain level controlled  Post assessment: Patient's Cardiovascular Status Stable  Last Vitals:  Filed Vitals:   08/27/14 1650  BP: 130/86  Pulse: 80  Temp: 36.1 C  Resp: 18    Post vital signs: Reviewed and stable  Level of consciousness: sedated  Complications: No apparent anesthesia complications

## 2014-08-27 NOTE — Anesthesia Procedure Notes (Signed)
Procedure Name: LMA Insertion Date/Time: 08/27/2014 2:46 PM Performed by: Johnathan Hausen A Pre-anesthesia Checklist: Patient identified, Emergency Drugs available, Suction available, Patient being monitored and Timeout performed Patient Re-evaluated:Patient Re-evaluated prior to inductionOxygen Delivery Method: Circle system utilized Preoxygenation: Pre-oxygenation with 100% oxygen Intubation Type: Combination inhalational/ intravenous induction Ventilation: Mask ventilation without difficulty LMA: LMA with gastric port inserted LMA Size: 4.0 Tube type: Oral Tube secured with: Tape Dental Injury: Teeth and Oropharynx as per pre-operative assessment

## 2014-08-27 NOTE — Transfer of Care (Signed)
Immediate Anesthesia Transfer of Care Note  Patient: Jill Fox  Procedure(s) Performed: Procedure(s): TRANSURETHRAL RESECTION OF BLADDER TUMOR (TURBT) (N/A) CYSTOSCOPY WITH RETROGRADE PYELOGRAM (Bilateral)  Patient Location: PACU  Anesthesia Type:General  Level of Consciousness: awake, alert , oriented and patient cooperative  Airway & Oxygen Therapy: Patient Spontanous Breathing and Patient connected to face mask oxygen  Post-op Assessment: Report given to RN, Post -op Vital signs reviewed and stable and Patient moving all extremities  Post vital signs: Reviewed and stable  Last Vitals:  Filed Vitals:   08/27/14 1140  BP: 125/61  Pulse: 76  Temp: 36.4 C  Resp: 20    Complications: No apparent anesthesia complications

## 2014-08-27 NOTE — Op Note (Signed)
Preoperative diagnosis:  1. Recurrent low grade Ta bladder cancer  Postoperative diagnosis: 1. Same  Procedure(s): 1. TURBT (2.5 cm) with fulguration 2. Bilateral retrograde pyelograms 3. Cystoscopy  Surgeon: Dr. Roxy Horseman, Brooke Bonito  Resident: Dr. Amaryllis Dyke  Anesthesia: General  Complications: None immediate  EBL: Minimal  Specimens: Bladder tumors, labeled according to site in bladder (sent to pathology)  Intraoperative findings: 4 small non-invasive, papillary appearing tumors. 2 near right orthotopic UO. 2 at posterior bladder wall near left reimplanted ureter, abnormal papillary appearing tissue at the site of prior bladder biopsies.  Indication: 81F with recurrent low grade Ta papillary bladder tumors, returns for repeat TURBT of recurrent lesions. I discussed the potential benefits and risks of the procedure, side effects of the proposed treatment, the likelihood of the patient achieving the goals of the procedure, and any potential problems that might occur during the procedure or recuperation.   Description of procedure:  The patient, consent and site were verified prior to bringing them to the OR where they were placed supine on the OR table. SCDs were placed and IV antibiotics were initiated. General anesthesia was induced and the patient was placed in the dorsal lithotomy position where they were prepped and draped in the usual sterile fashion. Pre-procedure time out was called and the case was begun.  The 21Fr rigid cystoscope with 30 degree lens was inserted into the bladder per urethra with ample lubrication and normal saline running for irrigation. Complete cystoscopy was performed with the above noted findings. Bilateral retrograde pyelograms were performed using omnipaque contrast via a 6 Fr ureteral catheter.  RPG findings: normal. No filling defects, strictures, or hydronephrosis.   Cold cup forceps were used to transurethrally resect the tumors and the  sites were fulgurated to obtain hemostasis. The samples were sent for pathology appropriately labeled by bladder site. Her bladder was emptied and she was awoken from general anesthesia having tolerated the procedure well.

## 2014-08-27 NOTE — Interval H&P Note (Signed)
History and Physical Interval Note:  08/27/2014 1:39 PM  [place]  has presented today for surgery, with the diagnosis of BLADDER CANCER  The various methods of treatment have been discussed with the patient and family. After consideration of risks, benefits and other options for treatment, the patient has consented to  Procedure(s): TRANSURETHRAL RESECTION OF BLADDER TUMOR (TURBT) (N/A) CYSTOSCOPY WITH RETROGRADE PYELOGRAM (Bilateral) as a surgical intervention .  The patient's history has been reviewed, patient examined, no change in status, stable for surgery.  I have reviewed the patient's chart and labs.  Questions were answered to the patient's satisfaction.     Anays Detore,LES

## 2014-08-27 NOTE — Discharge Instructions (Addendum)
1. You may see some blood in the urine and may have some burning with urination for 48-72 hours. You also may notice that you have to urinate more frequently or urgently after your procedure which is normal.  You should call should you develop an inability urinate, fever > 101, persistent nausea and vomiting that prevents you from eating or drinking to stay hydrated.    Cystoscopy, Care After Refer to this sheet in the next few weeks. These instructions provide you with information on caring for yourself after your procedure. Your caregiver may also give you more specific instructions. Your treatment has been planned according to current medical practices, but problems sometimes occur. Call your caregiver if you have any problems or questions after your procedure. HOME CARE INSTRUCTIONS  Things you can do to ease any discomfort after your procedure include: Drinking enough water and fluids to keep your urine clear or pale yellow. Taking a warm bath to relieve any burning feelings. SEEK IMMEDIATE MEDICAL CARE IF:  You have an increase in blood in your urine. You notice blood clots in your urine. You have difficulty passing urine. You have the chills. You have abdominal pain. You have a fever or persistent symptoms for more than 2-3 days. You have a fever and your symptoms suddenly get worse. MAKE SURE YOU:  Understand these instructions. Will watch your condition. Will get help right away if you are not doing well or get worse. Document Released: 11/14/2004 Document Revised: 12/28/2012 Document Reviewed: 10/19/2011 [facility] Patient Information 2015 [place], [place]. This information is not intended to replace advice given to you by your health care provider. Make sure you discuss any questions you have with your health care provider. 2.

## 2014-08-28 ENCOUNTER — Encounter (HOSPITAL_COMMUNITY): Payer: Self-pay | Admitting: Urology

## 2016-07-01 ENCOUNTER — Other Ambulatory Visit: Payer: Self-pay | Admitting: Urology

## 2016-07-02 ENCOUNTER — Encounter (HOSPITAL_COMMUNITY): Payer: Self-pay | Admitting: *Deleted

## 2016-07-10 NOTE — Patient Instructions (Addendum)
Jill Fox  07/10/2016   Your procedure is scheduled on: 07/23/16  Report to [facility] Main  Entrance take [facility]  elevators to 3rd floor to  [place] at 1:45 PM.  Call this number if you have problems the morning of surgery [phone]   Remember: ONLY 1 PERSON MAY GO WITH YOU TO SHORT STAY TO GET  READY MORNING OF [place].   Do not eat food  :After Midnight.  You may have clear liquids from midnight until 9:45 AM morning of surgery.  Then nothing to eat or drink.     Take these medicines the morning of surgery with A SIP OF WATER: Alprazolam if needed, Atenolol, escitalopram, oxycodone-acetaminophen if needed, pravastatin.                                You may not have any metal on your body including hair pins and              piercings  Do not wear jewelry, make-up, lotions, powders or perfumes, deodorant             Do not wear nail polish.  Do not shave  48 hours prior to surgery.              Men may shave face and neck.   Do not bring valuables to the hospital. [place].  Contacts, dentures or bridgework may not be worn into surgery.  Leave suitcase in the car. After surgery it may be brought to your room.     Patients discharged the day of surgery will not be allowed to drive home.  Name and phone number of your driver:  Special Instructions: N/A              Please read over the following fact sheets you were given: _____________________________________________________________________             [facility] - Preparing for Surgery Before surgery, you can play an important role.  Because skin is not sterile, your skin needs to be as free of germs as possible.  You can reduce the number of germs on your skin by washing with CHG (chlorahexidine gluconate) soap before surgery.  CHG is an antiseptic cleaner which kills germs and bonds with the skin to continue killing germs even  after washing. Please DO NOT use if you have an allergy to CHG or antibacterial soaps.  If your skin becomes reddened/irritated stop using the CHG and inform your nurse when you arrive at Short Stay. Do not shave (including legs and underarms) for at least 48 hours prior to the first CHG shower.  You may shave your face/neck. Please follow these instructions carefully:  1.  Shower with CHG Soap the night before surgery and the  morning of Surgery.  2.  If you choose to wash your hair, wash your hair first as usual with your  normal  shampoo.  3.  After you shampoo, rinse your hair and body thoroughly to remove the  shampoo.                           4.  Use CHG as you  would any other liquid soap.  You can apply chg directly  to the skin and wash                       Gently with a scrungie or clean washcloth.  5.  Apply the CHG Soap to your body ONLY FROM THE NECK DOWN.   Do not use on face/ open                           Wound or open sores. Avoid contact with eyes, ears mouth and genitals (private parts).                       Wash face,  Genitals (private parts) with your normal soap.             6.  Wash thoroughly, paying special attention to the area where your surgery  will be performed.  7.  Thoroughly rinse your body with warm water from the neck down.  8.  DO NOT shower/wash with your normal soap after using and rinsing off  the CHG Soap.                9.  Pat yourself dry with a clean towel.            10.  Wear clean pajamas.            11.  Place clean sheets on your bed the night of your first shower and do not  sleep with pets. Day of Surgery : Do not apply any lotions/deodorants the morning of surgery.  Please wear clean clothes to the hospital/surgery center.  FAILURE TO FOLLOW THESE INSTRUCTIONS MAY RESULT IN THE CANCELLATION OF YOUR SURGERY PATIENT SIGNATURE_________________________________  NURSE  SIGNATURE__________________________________  ________________________________________________________________________ How to Manage Your Diabetes Before and After Surgery  Why is it important to control my blood sugar before and after surgery? . Improving blood sugar levels before and after surgery helps healing and can limit problems. . A way of improving blood sugar control is eating a healthy diet by: o  Eating less sugar and carbohydrates o  Increasing activity/exercise o  Talking with your doctor about reaching your blood sugar goals . High blood sugars (greater than 180 mg/dL) can raise your risk of infections and slow your recovery, so you will need to focus on controlling your diabetes during the weeks before surgery. . Make sure that the doctor who takes care of your diabetes knows about your planned surgery including the date and location.  How do I manage my blood sugar before surgery? . Check your blood sugar at least 4 times a day, starting 2 days before surgery, to make sure that the level is not too high or low. o Check your blood sugar the morning of your surgery when you wake up and every 2 hours until you get to the Short Stay unit. . If your blood sugar is less than 70 mg/dL, you will need to treat for low blood sugar: o Do not take insulin. o Treat a low blood sugar (less than 70 mg/dL) with  cup of clear juice (cranberry or apple), 4 glucose tablets, OR glucose gel. o Recheck blood sugar in 15 minutes after treatment (to make sure it is greater than 70 mg/dL). If your blood sugar is not greater than 70 mg/dL on recheck, call [phone] for further instructions. . Report your  blood sugar to the short stay nurse when you get to Short Stay.  . If you are admitted to the hospital after surgery: o Your blood sugar will be checked by the staff and you will probably be given insulin after surgery (instead of oral diabetes medicines) to make sure you have good blood sugar  levels. o The goal for blood sugar control after surgery is 80-180 mg/dL.   WHAT DO I DO ABOUT MY DIABETES MEDICATION?  Marland Kitchen Day before surgery o Glipizide - only take morning dose only o Liraglutide - take usual 1.8mg  evening dose. o Metformin - take usual 1 tablet two times this day as usual  . Day of surgery o Glipizide - do not take. o Liraglutide - do not take. o Metformin - do not take.  . Do not take oral diabetes medicines (pills) the morning of surgery.       . The day of surgery, do not take other diabetes injectables, including Byetta (exenatide), Bydureon (exenatide ER), Victoza (liraglutide), or Trulicity (dulaglutide).     CLEAR LIQUID DIET   Foods Allowed                                                                     Foods Excluded  Coffee and tea, regular and decaf                             liquids that you cannot  Plain Jell-O in any flavor                                             see through such as: Fruit ices (not with fruit pulp)                                     milk, soups, orange juice  Iced Popsicles                                    All solid food Carbonated beverages, regular and diet                                    Cranberry, grape and apple juices Sports drinks like Gatorade Lightly seasoned clear broth or consume(fat free) Sugar, honey syrup  Sample Menu Breakfast                                Lunch                                     Supper Cranberry juice                    Beef broth  Chicken broth Jell-O                                     Grape juice                           Apple juice Coffee or tea                        Jell-O                                      Popsicle                                                Coffee or tea                        Coffee or tea  _____________________________________________________________________

## 2016-07-13 ENCOUNTER — Ambulatory Visit (HOSPITAL_COMMUNITY)
Admission: RE | Admit: 2016-07-13 | Discharge: 2016-07-13 | Disposition: A | Discharge status: Home / Self Care | Payer: Medicare HMO | Source: Ambulatory Visit | Attending: Urology | Admitting: Urology

## 2016-07-13 ENCOUNTER — Encounter (HOSPITAL_COMMUNITY)
Admission: RE | Admit: 2016-07-13 | Discharge: 2016-07-13 | Disposition: A | Discharge status: Home / Self Care | Payer: Medicare HMO | Source: Ambulatory Visit | Attending: Urology | Admitting: Urology

## 2016-07-13 ENCOUNTER — Encounter (HOSPITAL_COMMUNITY): Payer: Self-pay

## 2016-07-13 DIAGNOSIS — I700 Atherosclerosis of aorta: Secondary | ICD-10-CM | POA: Diagnosis not present

## 2016-07-13 DIAGNOSIS — N189 Chronic kidney disease, unspecified: Secondary | ICD-10-CM | POA: Diagnosis not present

## 2016-07-13 DIAGNOSIS — I129 Hypertensive chronic kidney disease with stage 1 through stage 4 chronic kidney disease, or unspecified chronic kidney disease: Secondary | ICD-10-CM | POA: Diagnosis not present

## 2016-07-13 DIAGNOSIS — E1122 Type 2 diabetes mellitus with diabetic chronic kidney disease: Secondary | ICD-10-CM | POA: Insufficient documentation

## 2016-07-13 DIAGNOSIS — Z01810 Encounter for preprocedural cardiovascular examination: Secondary | ICD-10-CM | POA: Insufficient documentation

## 2016-07-13 DIAGNOSIS — K5732 Diverticulitis of large intestine without perforation or abscess without bleeding: Secondary | ICD-10-CM | POA: Diagnosis not present

## 2016-07-13 DIAGNOSIS — C679 Malignant neoplasm of bladder, unspecified: Secondary | ICD-10-CM | POA: Insufficient documentation

## 2016-07-13 DIAGNOSIS — Z01811 Encounter for preprocedural respiratory examination: Secondary | ICD-10-CM

## 2016-07-13 DIAGNOSIS — E785 Hyperlipidemia, unspecified: Secondary | ICD-10-CM | POA: Diagnosis not present

## 2016-07-13 DIAGNOSIS — Z01812 Encounter for preprocedural laboratory examination: Secondary | ICD-10-CM | POA: Diagnosis not present

## 2016-07-13 DIAGNOSIS — Z01818 Encounter for other preprocedural examination: Secondary | ICD-10-CM | POA: Insufficient documentation

## 2016-07-13 DIAGNOSIS — I252 Old myocardial infarction: Secondary | ICD-10-CM | POA: Insufficient documentation

## 2016-07-13 LAB — BASIC METABOLIC PANEL
Anion gap: 8 (ref 5–15)
BUN: 34 mg/dL — ABNORMAL HIGH (ref 6–20)
CO2: 25 mmol/L (ref 22–32)
Calcium: 9.6 mg/dL (ref 8.9–10.3)
Chloride: 105 mmol/L (ref 101–111)
Creatinine, Ser: 1.36 mg/dL — ABNORMAL HIGH (ref 0.44–1.00)
GFR calc non Af Amer: 40 mL/min — ABNORMAL LOW (ref >60)
GFR, EST AFRICAN AMERICAN: 46 mL/min — AB (ref >60)
Glucose, Bld: 107 mg/dL — ABNORMAL HIGH (ref 65–99)
POTASSIUM: 4.5 mmol/L (ref 3.5–5.1)
Sodium: 138 mmol/L (ref 135–145)

## 2016-07-13 LAB — SURGICAL PCR SCREEN
MRSA, PCR: NEGATIVE
STAPHYLOCOCCUS AUREUS: NEGATIVE

## 2016-07-13 LAB — CBC
HEMATOCRIT: 37.1 % (ref 36.0–46.0)
HEMOGLOBIN: 12.2 g/dL (ref 12.0–15.0)
MCH: 27.9 pg (ref 26.0–34.0)
MCHC: 32.9 g/dL (ref 30.0–36.0)
MCV: 84.9 fL (ref 78.0–100.0)
Platelets: 270 10*3/uL (ref 150–400)
RBC: 4.37 MIL/uL (ref 3.87–5.11)
RDW: 14.1 % (ref 11.5–15.5)
WBC: 11.4 10*3/uL — AB (ref 4.0–10.5)

## 2016-07-13 LAB — GLUCOSE, CAPILLARY: Glucose-Capillary: 121 mg/dL — ABNORMAL HIGH (ref 65–99)

## 2016-07-13 NOTE — Progress Notes (Signed)
05/19/2016- labs on chart from [facility]. D-25,CMP, Free T3, Free T4, Lipid profile, TSh, Vitamin B12, CBC with diff.,HGA1c and Last office visit from Dr. Heide Scales

## 2016-07-14 LAB — HEMOGLOBIN A1C
HEMOGLOBIN A1C: 6 % — AB (ref 4.8–5.6)
Mean Plasma Glucose: 126 mg/dL

## 2016-07-22 NOTE — H&P (Signed)
Office Visit Report     07/13/2016   --------------------------------------------------------------------------------   Jill Fox  MRN: [id number]  PRIMARY CARE:  Imagene Riches, NP  DOB: 17-Mar-1951, 66 year old Female  REFERRING:  Raynelle Bring, MD  SSN: -[phone]  PROVIDER:  Raynelle Bring, M.D.    TREATING:  Jiles Crocker    LOCATION:  [facility] [phone]   --------------------------------------------------------------------------------   CC/HPI: She is here today for preop history and physical. She is scheduled to have a TURBT on 3/15. She is doing well overall. No recent hematuria or dysuria. No worsening of urine stream. Denies recent fevers or infections.     ALLERGIES: No Allergies    MEDICATIONS: Alprazolam 0.5 mg tablet Oral  Aspirin 325 MG Oral Tablet Oral  Atenolol 25 mg tablet Oral  Clonazepam 1 mg tablet Oral  Escitalopram Oxalate 5 mg tablet Oral  Gabapentin 300 mg capsule Oral  Glipizide 10 mg tablet Oral  Ibuprofen 600 mg tablet Oral  Lexapro 10 mg tablet Oral  Lisinopril-Hydrochlorothiazide 20-25 MG Oral Tablet Oral  Nystatin 100,000 unit/gram cream External  Pravastatin Sodium 40 mg tablet Oral  Victoza 3-Pak 0.6 mg/0.1 ml (18 mg/3 ml) pen injector Subcutaneous     GU PSH: Cysto Bladder Ureth Biopsy - 2011 Cystoscopy - 03/27/2016 Cystoscopy Insert Stent - 2012 Cystoscopy TURBT <2 cm - 2014, 2013 Cystoscopy TURBT 2-5 cm - 09/04/2014      PSH Notes: Cystoscopy With Fulguration Medium Lesion (2-5cm), Cystoscopy With Fulguration Small Lesion (5-28mm), Cystoscopy With Fulguration Small Lesion (5-42mm), Cystoscopy With Insertion Of Ureteral Stent Left, Closure Of Enterovesical Fistula, Cystoscopy With Biopsy, Vesicoureteral Reimplantation, Partial Colectomy, Tubal Ligation   NON-GU PSH: Partial colectomy - 2011 Repair Bowel-bladder Fistula - 2012 Tubal Ligation - 2011    GU PMH: Bladder Cancer, overlapping sites, Malignant  neoplasm of overlapping sites of bladder - 08/06/2015 Urinary Tract Inf, Unspec site, Pyuria - 2015, Urinary tract infection, - 2014 Bladder Cancer, History, Bladder Cancer - 2014 Mixed incontinence, Urge and stress incontinence - 2014 Nocturia, Nocturia - 2014 Urinary Frequency, Increased urinary frequency - 2014 Vesicointestinal fistula, Colovesical fistula - 2014      PMH Notes:   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 She is s/p repair by Dr. Marlou Starks and myself on 05/20/10.   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   Sep 2010: TURBT at [facility] (low grade, Ta)  Jul 2011: TURBT by Dr. Burnell Blanks (low grade Ta)  Jan 2013: TURBT (low grade Ta, multiple tumors)  Mar 2014: TURBT (low grade Ta, multiple tumors)  Apr 2016: TURBT (low grade Ta, multiple tumors)   Last upper tract imaging: Apr 2016 ([facility])     NON-GU PMH: Bacteriuria, Bacteriuria, asymptomatic - 12/18/2014 Personal history of other diseases of the circulatory system, History of hypertension - 2014 Personal history of other diseases of the digestive system, History of diverticulitis of colon - 2014 Personal history of other endocrine, nutritional and metabolic disease, History of diabetes mellitus - 2014    FAMILY HISTORY: Bladder Cancer - No Family History Heart Disease - Father Kidney Cancer - No Family History   SOCIAL HISTORY: Marital Status: Single Current Smoking Status: Patient smokes.  Does drink.  Drinks 1 caffeinated drink per day.     Notes: Current every day smoker, Marital History - Single,  Tobacco Use, Alcohol Use, Occupation:   REVIEW OF SYSTEMS:    GU Review Female:   Patient denies frequent urination, hard to postpone urination, burning /pain with urination, get up at night to urinate, leakage of urine, stream starts and  stops, trouble starting your stream, have to strain to urinate, and currently pregnant.  Gastrointestinal (Upper):   Patient denies nausea, vomiting, and indigestion/ heartburn.  Gastrointestinal (Lower):   Patient denies diarrhea and constipation.  Constitutional:   Patient denies fever, night sweats, weight loss, and fatigue.  Skin:   Patient denies skin rash/ lesion and itching.  Eyes:   Patient denies blurred vision and double vision.  Ears/ Nose/ Throat:   Patient denies sore throat and sinus problems.  Hematologic/Lymphatic:   Patient denies swollen glands and easy bruising.  Cardiovascular:   Patient denies leg swelling and chest pains.  Respiratory:   Patient denies cough and shortness of breath.  Endocrine:   Patient denies excessive thirst.  Musculoskeletal:   Patient denies back pain and joint pain.  Neurological:   Patient denies headaches and dizziness.  Psychologic:   Patient denies depression and anxiety.   VITAL SIGNS:      07/13/2016 11:20 AM  Weight 160 lb / 72.57 kg  Height 60 in / 152.4 cm  BP 83/52 mmHg  Pulse 78 /min  Temperature 98.4 F / 37 C  BMI 31.2 kg/m   MULTI-SYSTEM PHYSICAL EXAMINATION:    Constitutional: Well-nourished. No physical deformities. Normally developed. Good grooming.  Respiratory: No labored breathing, no use of accessory muscles. CTA.  Cardiovascular: Normal temperature, normal extremity pulses, no swelling, no varicosities. RRR.  Skin: No paleness, no jaundice, no cyanosis. No lesion, no ulcer, no rash.  Neurologic / Psychiatric: Oriented to time, oriented to place, oriented to person. No depression, no anxiety, no agitation.  Gastrointestinal: No mass, no tenderness, no rigidity, non obese abdomen.   Musculoskeletal: Spine, ribs, pelvis no bilateral tenderness. Normal gait and station of head and neck.     PAST DATA REVIEWED:  Source Of History:  Patient   PROCEDURES:          Urinalysis w/Scope Dipstick Dipstick Cont'd Micro   Color: Yellow Bilirubin: Neg WBC/hpf: 0 - 5/hpf  Appearance: Cloudy Ketones: Trace RBC/hpf: 3 - 10/hpf  Specific Gravity: 1.025 Blood: Neg Bacteria: NS (Not Seen)  pH: <=5.0 Protein: Neg Cystals: NS (Not Seen)  Glucose: Neg Urobilinogen: 1.0 Casts: NS (Not Seen)    Nitrites: Neg Trichomonas: Not Present    Leukocyte Esterase: Trace Mucous: Not Present      Epithelial Cells: NS (Not Seen)      Yeast: NS (Not Seen)      Sperm: Not Present    ASSESSMENT:      ICD-10 Details  1 GU:   Bladder Cancer, overlapping sites - C67.8   2   Bladder Cancer, History - Z85.51    PLAN:           Orders Labs Urine Culture and Sensitivity          Schedule Return Visit/Planned Activity: Keep Scheduled Appointment - Schedule Surgery          Document Letter(s):  Created for Patient: Clinical Summary         Notes:   Physical exam within normal limits. She is doing well with no evidence of lower urinary tract infection or recent antimicrobial treatment. I'll send urine for culture today. She'll keep her scheduled appointment for TURBT on 3/15.    *  Signed by Jiles Crocker on 07/13/16 at 12:30 PM (EST)*

## 2016-07-23 ENCOUNTER — Encounter (HOSPITAL_COMMUNITY)
Admission: RE | Disposition: A | Discharge status: Home / Self Care | Payer: Self-pay | Source: Ambulatory Visit | Attending: Urology

## 2016-07-23 ENCOUNTER — Ambulatory Visit (HOSPITAL_COMMUNITY): Payer: Medicare HMO | Admitting: Anesthesiology

## 2016-07-23 ENCOUNTER — Ambulatory Visit (HOSPITAL_COMMUNITY)
Admission: RE | Admit: 2016-07-23 | Discharge: 2016-07-23 | Disposition: A | Discharge status: Home / Self Care | Payer: Medicare HMO | Source: Ambulatory Visit | Attending: Urology | Admitting: Urology

## 2016-07-23 ENCOUNTER — Encounter (HOSPITAL_COMMUNITY): Payer: Self-pay | Admitting: *Deleted

## 2016-07-23 DIAGNOSIS — Z7984 Long term (current) use of oral hypoglycemic drugs: Secondary | ICD-10-CM | POA: Insufficient documentation

## 2016-07-23 DIAGNOSIS — I10 Essential (primary) hypertension: Secondary | ICD-10-CM | POA: Diagnosis not present

## 2016-07-23 DIAGNOSIS — C676 Malignant neoplasm of ureteric orifice: Secondary | ICD-10-CM | POA: Insufficient documentation

## 2016-07-23 DIAGNOSIS — Z9049 Acquired absence of other specified parts of digestive tract: Secondary | ICD-10-CM | POA: Diagnosis not present

## 2016-07-23 DIAGNOSIS — Z79899 Other long term (current) drug therapy: Secondary | ICD-10-CM | POA: Diagnosis not present

## 2016-07-23 DIAGNOSIS — M1990 Unspecified osteoarthritis, unspecified site: Secondary | ICD-10-CM | POA: Insufficient documentation

## 2016-07-23 DIAGNOSIS — Z7982 Long term (current) use of aspirin: Secondary | ICD-10-CM | POA: Diagnosis not present

## 2016-07-23 DIAGNOSIS — C671 Malignant neoplasm of dome of bladder: Secondary | ICD-10-CM | POA: Diagnosis not present

## 2016-07-23 DIAGNOSIS — Z8551 Personal history of malignant neoplasm of bladder: Secondary | ICD-10-CM | POA: Diagnosis not present

## 2016-07-23 DIAGNOSIS — Z8249 Family history of ischemic heart disease and other diseases of the circulatory system: Secondary | ICD-10-CM | POA: Insufficient documentation

## 2016-07-23 DIAGNOSIS — F17200 Nicotine dependence, unspecified, uncomplicated: Secondary | ICD-10-CM | POA: Diagnosis not present

## 2016-07-23 DIAGNOSIS — C679 Malignant neoplasm of bladder, unspecified: Secondary | ICD-10-CM | POA: Diagnosis present

## 2016-07-23 DIAGNOSIS — E119 Type 2 diabetes mellitus without complications: Secondary | ICD-10-CM | POA: Insufficient documentation

## 2016-07-23 HISTORY — PX: TRANSURETHRAL RESECTION OF BLADDER TUMOR: SHX2575

## 2016-07-23 HISTORY — PX: CYSTOSCOPY W/ RETROGRADES: SHX1426

## 2016-07-23 LAB — GLUCOSE, CAPILLARY
GLUCOSE-CAPILLARY: 149 mg/dL — AB (ref 65–99)
Glucose-Capillary: 145 mg/dL — ABNORMAL HIGH (ref 65–99)

## 2016-07-23 SURGERY — CYSTOSCOPY, WITH RETROGRADE PYELOGRAM
Anesthesia: General

## 2016-07-23 MED ORDER — FENTANYL CITRATE (PF) 100 MCG/2ML IJ SOLN
INTRAMUSCULAR | Status: AC
Start: 2016-07-23 — End: ?
  Filled 2016-07-23: qty 2

## 2016-07-23 MED ORDER — LIDOCAINE HCL (CARDIAC) 20 MG/ML IV SOLN
INTRAVENOUS | Status: DC | PRN
  Administered 2016-07-23: 60 mg via INTRAVENOUS

## 2016-07-23 MED ORDER — PHENYLEPHRINE HCL 10 MG/ML IJ SOLN
INTRAMUSCULAR | Status: DC | PRN
  Administered 2016-07-23 (×2): 40 ug via INTRAVENOUS
  Administered 2016-07-23: 20 ug via INTRAVENOUS
  Administered 2016-07-23 (×2): 40 ug via INTRAVENOUS

## 2016-07-23 MED ORDER — DEXAMETHASONE SODIUM PHOSPHATE 10 MG/ML IJ SOLN
INTRAMUSCULAR | Status: DC | PRN
  Administered 2016-07-23: 10 mg via INTRAVENOUS

## 2016-07-23 MED ORDER — FENTANYL CITRATE (PF) 100 MCG/2ML IJ SOLN: 25.0000 ug | INTRAMUSCULAR | Status: DC | PRN

## 2016-07-23 MED ORDER — EPHEDRINE SULFATE 50 MG/ML IJ SOLN
INTRAMUSCULAR | Status: DC | PRN
  Administered 2016-07-23 (×6): 5 mg via INTRAVENOUS

## 2016-07-23 MED ORDER — OXYCODONE HCL 5 MG PO TABS: 5.0000 mg | ORAL_TABLET | Freq: Once | ORAL | Status: DC | PRN

## 2016-07-23 MED ORDER — CIPROFLOXACIN IN D5W 400 MG/200ML IV SOLN
400.0000 mg | INTRAVENOUS | Status: AC
  Administered 2016-07-23: 400 mg via INTRAVENOUS
  Filled 2016-07-23: qty 200

## 2016-07-23 MED ORDER — PROPOFOL 10 MG/ML IV BOLUS
INTRAVENOUS | Status: DC | PRN
  Administered 2016-07-23: 100 mg via INTRAVENOUS

## 2016-07-23 MED ORDER — IOHEXOL 300 MG/ML  SOLN
INTRAMUSCULAR | Status: DC | PRN
  Administered 2016-07-23: 25 mL

## 2016-07-23 MED ORDER — SODIUM CHLORIDE 0.9 % IR SOLN
Status: DC | PRN
  Administered 2016-07-23: 3000 mL

## 2016-07-23 MED ORDER — ONDANSETRON HCL 4 MG/2ML IJ SOLN
INTRAMUSCULAR | Status: DC | PRN
  Administered 2016-07-23: 4 mg via INTRAVENOUS

## 2016-07-23 MED ORDER — LIDOCAINE 2% (20 MG/ML) 5 ML SYRINGE
INTRAMUSCULAR | Status: AC
  Filled 2016-07-23: qty 5

## 2016-07-23 MED ORDER — PHENAZOPYRIDINE HCL 100 MG PO TABS: 100.0000 mg | ORAL_TABLET | Freq: Three times a day (TID) | ORAL | 0 refills | Status: DC | PRN

## 2016-07-23 MED ORDER — MIDAZOLAM HCL 5 MG/5ML IJ SOLN
INTRAMUSCULAR | Status: DC | PRN
  Administered 2016-07-23 (×2): 0.5 mg via INTRAVENOUS

## 2016-07-23 MED ORDER — MIDAZOLAM HCL 2 MG/2ML IJ SOLN
INTRAMUSCULAR | Status: AC
  Filled 2016-07-23: qty 2

## 2016-07-23 MED ORDER — PROPOFOL 10 MG/ML IV BOLUS
INTRAVENOUS | Status: AC
  Filled 2016-07-23: qty 20

## 2016-07-23 MED ORDER — ONDANSETRON HCL 4 MG/2ML IJ SOLN: 4.0000 mg | Freq: Four times a day (QID) | INTRAMUSCULAR | Status: DC | PRN

## 2016-07-23 MED ORDER — OXYCODONE HCL 5 MG/5ML PO SOLN
5.0000 mg | Freq: Once | ORAL | Status: DC | PRN
Start: 2016-07-23 — End: 2016-07-23
  Filled 2016-07-23: qty 5

## 2016-07-23 MED ORDER — DEXAMETHASONE SODIUM PHOSPHATE 10 MG/ML IJ SOLN
INTRAMUSCULAR | Status: AC
  Filled 2016-07-23: qty 1

## 2016-07-23 MED ORDER — 0.9 % SODIUM CHLORIDE (POUR BTL) OPTIME
TOPICAL | Status: DC | PRN
  Administered 2016-07-23: 1000 mL

## 2016-07-23 MED ORDER — ONDANSETRON HCL 4 MG/2ML IJ SOLN
INTRAMUSCULAR | Status: AC
  Filled 2016-07-23: qty 2

## 2016-07-23 MED ORDER — FENTANYL CITRATE (PF) 100 MCG/2ML IJ SOLN
INTRAMUSCULAR | Status: DC | PRN
  Administered 2016-07-23: 50 ug via INTRAVENOUS

## 2016-07-23 MED ORDER — SODIUM CHLORIDE 0.9 % IV SOLN: INTRAVENOUS | Status: DC | PRN

## 2016-07-23 MED ORDER — LACTATED RINGERS IV SOLN
INTRAVENOUS | Status: DC
  Administered 2016-07-23 (×2): via INTRAVENOUS

## 2016-07-23 SURGICAL SUPPLY — 17 items
BAG URINE DRAINAGE (UROLOGICAL SUPPLIES) IMPLANT
BAG URO CATCHER STRL LF (MISCELLANEOUS) ×3 IMPLANT
CATH INTERMIT  6FR 70CM (CATHETERS) ×3 IMPLANT
CLOTH BEACON ORANGE TIMEOUT ST (SAFETY) ×3 IMPLANT
ELECT REM PT RETURN 9FT ADLT (ELECTROSURGICAL) ×3
ELECTRODE REM PT RTRN 9FT ADLT (ELECTROSURGICAL) ×1 IMPLANT
EVACUATOR MICROVAS BLADDER (UROLOGICAL SUPPLIES) IMPLANT
GLOVE BIOGEL M STRL SZ7.5 (GLOVE) ×3 IMPLANT
GOWN STRL REUS W/TWL LRG LVL3 (GOWN DISPOSABLE) ×6 IMPLANT
GUIDEWIRE STR DUAL SENSOR (WIRE) ×3 IMPLANT
LOOP CUT BIPOLAR 24F LRG (ELECTROSURGICAL) ×3 IMPLANT
MANIFOLD NEPTUNE II (INSTRUMENTS) ×3 IMPLANT
PACK CYSTO (CUSTOM PROCEDURE TRAY) ×3 IMPLANT
SET ASPIRATION TUBING (TUBING) ×3 IMPLANT
SYRINGE IRR TOOMEY STRL 70CC (SYRINGE) IMPLANT
TUBING CONNECTING 10 (TUBING) ×2 IMPLANT
TUBING CONNECTING 10' (TUBING) ×1

## 2016-07-23 NOTE — Transfer of Care (Signed)
Immediate Anesthesia Transfer of Care Note  Patient: Jill Fox  Procedure(s) Performed: Procedure(s): CYSTOSCOPY WITH RETROGRADE PYELOGRAM (N/A) TRANSURETHRAL RESECTION OF BLADDER TUMOR (TURBT) (N/A)  Patient Location: PACU  Anesthesia Type:General  Level of Consciousness: awake, oriented, patient cooperative, lethargic and responds to stimulation  Airway & Oxygen Therapy: Patient Spontanous Breathing and Patient connected to face mask oxygen  Post-op Assessment: Report given to RN, Post -op Vital signs reviewed and stable and Patient moving all extremities  Post vital signs: Reviewed and stable  Last Vitals:  Vitals:   07/23/16 1343  BP: (!) 116/53  Pulse: 78  Resp: 16  Temp: 36.9 C    Last Pain:  Vitals:   07/23/16 1343  TempSrc: Oral      Patients Stated Pain Goal: 3 (63/84/53 6468)  Complications: No apparent anesthesia complications

## 2016-07-23 NOTE — Anesthesia Preprocedure Evaluation (Signed)
Anesthesia Evaluation  Patient identified by MRN, date of birth, ID band Patient awake    Reviewed: Allergy & Precautions, H&P , NPO status , Patient's Chart, lab work & pertinent test results  Airway Mallampati: II   Neck ROM: full    Dental   Pulmonary Current Smoker,    breath sounds clear to auscultation       Cardiovascular hypertension,  Rhythm:regular Rate:Normal     Neuro/Psych PSYCHIATRIC DISORDERS Anxiety    GI/Hepatic   Endo/Other  diabetes, Type 2obese  Renal/GU Renal InsufficiencyRenal disease     Musculoskeletal  (+) Arthritis ,   Abdominal   Peds  Hematology   Anesthesia Other Findings   Reproductive/Obstetrics                             Anesthesia Physical Anesthesia Plan  ASA: III  Anesthesia Plan: General   Post-op Pain Management:    Induction: Intravenous  Airway Management Planned: LMA  Additional Equipment:   Intra-op Plan:   Post-operative Plan:   Informed Consent: I have reviewed the patients History and Physical, chart, labs and discussed the procedure including the risks, benefits and alternatives for the proposed anesthesia with the patient or authorized representative who has indicated his/her understanding and acceptance.     Plan Discussed with: CRNA, Anesthesiologist and Surgeon  Anesthesia Plan Comments:         Anesthesia Quick Evaluation

## 2016-07-23 NOTE — Anesthesia Procedure Notes (Signed)
Procedure Name: LMA Insertion Date/Time: 07/23/2016 3:37 PM Performed by: Marcie Bal, ADAM Pre-anesthesia Checklist: Patient identified, Emergency Drugs available, Suction available, Patient being monitored and Timeout performed Patient Re-evaluated:Patient Re-evaluated prior to inductionOxygen Delivery Method: Circle system utilized Preoxygenation: Pre-oxygenation with 100% oxygen Intubation Type: IV induction LMA: LMA with gastric port inserted LMA Size: 4.0 Number of attempts: 1 Placement Confirmation: positive ETCO2 Tube secured with: Tape Dental Injury: Teeth and Oropharynx as per pre-operative assessment  Comments: Lower teeth poor dentition, loose.  Intact after LMA placement

## 2016-07-23 NOTE — Op Note (Signed)
Preoperative diagnosis: 1. Bladder tumor (1 cm)  Postoperative diagnosis:  1. Bladder tumor (1 cm)  Procedure:  1. Cystoscopy 2. Transurethral resection of bladder tumor (4 tumors - largest 1 cm) 3. Bilateral retrograde pyelography with interpretation  Surgeon: Pryor Curia. M.D.  Anesthesia: General  Complications: None  Intraoperative findings:  1. Bladder tumor: There were noted be multiple small papillary, low-grade appearing tumors.  There were 4 tumors in the vicinity of the left neo-ureteral orifice.  This included one tumor that measured approximately one centimeters lateral to the ureteral orifice and 3 tumors that were smaller more medial to the ureteral orifice.  None of these tumors appeared to be coming from the ureteral orifice. 2. Retrograde pyelography: There is no evidence of any filling defectsof the ureters or renal collecting systems bilaterally.  EBL: Minimal  Specimens: 1. Left lateral bladder tumor 2. Left medial bladder tumors  Disposition of specimens: Pathology  Indication: Jill Fox is a patient who was found to have a bladder tumor. She has a history of low-grade, recurrent Ta urothelial carcinoma. She was recently found to have multiple recurrences. After reviewing the management options for treatment, he elected to proceed with the above surgical procedure(s). We have discussed the potential benefits and risks of the procedure, side effects of the proposed treatment, the likelihood of the patient achieving the goals of the procedure, and any potential problems that might occur during the procedure or recuperation. Informed consent has been obtained.  Description of procedure:  The patient was taken to the operating room and general anesthesia was induced.  The patient was placed in the dorsal lithotomy position, prepped and draped in the usual sterile fashion, and preoperative antibiotics were administered. A preoperative time-out was  performed.   Cystourethroscopy was performed.  The patient's urethra was examined and was normal The bladder was then systematically examined in its entirety.  The patient's left ureteral orifice was seen in its expected location following her reimplantation.  The right ureteral orifice was in its expected location.  There were noted.  Multiple bladder tumor surrounding the left ureteral orifice as dictated above.  There are also a few small erythematous areas on the right side of the bladder as well as one very small papillary tumor at the dome measuring less than 0.5 cm.  Attention then turned to the right ureteral orifice and a ureteral catheter was used to intubate the ureteral orifice.  Omnipaque contrast was injected through the ureteral catheter and a retrograde pyelogram was performed with findings as dictated above.  Attention then turned to the left ureteral orifice and a ureteral catheter was used to intubate the ureteral orifice.  Omnipaque contrast was injected through the ureteral catheter and a retrograde pyelogram was performed with findings as dictated above.  I then placed the resectoscope into the bladder and resected the previously mentioned tumors around the left ureteral orifice.  Each of these areas was then cauterized to provide hemostasis.  Bipolar resection was utilized.  All tumors were visually removed.  The remaining abnormal erythematous areas were fulgurated.  No remaining visible tumor was identified.  The bladder was then emptied and the procedure ended.  The patient appeared to tolerate the procedure well and without complications.  The patient was able to be awakened and transferred to the recovery unit in satisfactory condition.    Pryor Curia MD

## 2016-07-23 NOTE — Interval H&P Note (Signed)
History and Physical Interval Note:  07/23/2016 3:00 PM  [place]  has presented today for surgery, with the diagnosis of BLADDER CANCER  The various methods of treatment have been discussed with the patient and family. After consideration of risks, benefits and other options for treatment, the patient has consented to  Procedure(s): CYSTOSCOPY WITH RETROGRADE PYELOGRAM (N/A) TRANSURETHRAL RESECTION OF BLADDER TUMOR (TURBT) (N/A) as a surgical intervention .  The patient's history has been reviewed, patient examined, no change in status, stable for surgery.  I have reviewed the patient's chart and labs.  Questions were answered to the patient's satisfaction.     Jill Fox,LES

## 2016-07-23 NOTE — Discharge Instructions (Addendum)
°. °  General Anesthesia, Adult, Care After These instructions provide you with information about caring for yourself after your procedure. Your health care provider may also give you more specific instructions. Your treatment has been planned according to current medical practices, but problems sometimes occur. Call your health care provider if you have any problems or questions after your procedure. What can I expect after the procedure? After the procedure, it is common to have:  Vomiting.  A sore throat.  Mental slowness. It is common to feel:  Nauseous.  Cold or shivery.  Sleepy.  Tired.  Sore or achy, even in parts of your body where you did not have surgery. Follow these instructions at home: For at least 24 hours after the procedure:   Do not:  Participate in activities where you could fall or become injured.  Drive.  Use heavy machinery.  Drink alcohol.  Take sleeping pills or medicines that cause drowsiness.  Make important decisions or sign legal documents.  Take care of children on your own.  Rest. Eating and drinking   If you vomit, drink water, juice, or soup when you can drink without vomiting.  Drink enough fluid to keep your urine clear or pale yellow.  Make sure you have little or no nausea before eating solid foods.  Follow the diet recommended by your health care provider. General instructions   Have a responsible adult stay with you until you are awake and alert.  Return to your normal activities as told by your health care provider. Ask your health care provider what activities are safe for you.  Take over-the-counter and prescription medicines only as told by your health care provider.  If you smoke, do not smoke without supervision.  Keep all follow-up visits as told by your health care provider. This is important. Contact a health care provider if:  You continue to have nausea or vomiting at home, and medicines are not  helpful.  You cannot drink fluids or start eating again.  You cannot urinate after 8-12 hours.  You develop a skin rash.  You have fever.  You have increasing redness at the site of your procedure. Get help right away if:  You have difficulty breathing.  You have chest pain.  You have unexpected bleeding.  You feel that you are having a life-threatening or urgent problem. This information is not intended to replace advice given to you by your health care provider. Make sure you discuss any questions you have with your health care provider. Document Released: 08/03/2000 Document Revised: 09/30/2015 Document Reviewed: 04/11/2015 Elsevier Interactive Patient Education  2017 [place] may see some blood in the urine and may have some burning with urination for 48-72 hours. You also may notice that you have to urinate more frequently or urgently after your procedure which is normal.  2. You should call should you develop an inability urinate, fever > 101, persistent nausea and vomiting that prevents you from eating or drinking to stay hydrated.

## 2016-07-23 NOTE — Anesthesia Postprocedure Evaluation (Addendum)
Anesthesia Post Note  Patient: Jill Fox  Procedure(s) Performed: Procedure(s) (LRB): CYSTOSCOPY WITH RETROGRADE PYELOGRAM (N/A) TRANSURETHRAL RESECTION OF BLADDER TUMOR (TURBT) (N/A)  Patient location during evaluation: PACU Anesthesia Type: General Level of consciousness: awake and alert and patient cooperative Pain management: pain level controlled Vital Signs Assessment: post-procedure vital signs reviewed and stable Respiratory status: spontaneous breathing and respiratory function stable Cardiovascular status: stable Anesthetic complications: no       Last Vitals:  Vitals:   07/23/16 1727 07/23/16 1810  BP: (!) 110/53 120/62  Pulse: 78 84  Resp: 18 16  Temp:      Last Pain:  Vitals:   07/23/16 1810  TempSrc:   PainSc: 0-No pain                 Immaculate Crutcher S

## 2016-07-24 ENCOUNTER — Encounter (HOSPITAL_COMMUNITY): Payer: Self-pay | Admitting: Urology

## 2016-12-30 NOTE — Addendum Note (Signed)
Addendum  created 12/30/16 1229 by Albertha Ghee, MD   Sign clinical note

## 2017-09-16 ENCOUNTER — Other Ambulatory Visit: Payer: Self-pay | Admitting: Urology

## 2017-09-22 ENCOUNTER — Other Ambulatory Visit (HOSPITAL_COMMUNITY): Payer: Self-pay | Admitting: Emergency Medicine

## 2017-09-22 NOTE — Patient Instructions (Addendum)
Jill Fox  09/22/2017   Your procedure is scheduled on: 09-27-17   Report to [facility] Main  Entrance    Report to admitting at 5:30AM    Call this number if you have problems the morning of surgery [phone]     Remember: Do not eat food or drink liquids :After Midnight.     Take these medicines the morning of surgery with A SIP OF WATER: ATENOLOL, BUPROPION(WELLBUTRIN), ESCITALOPRAM(LEXAPRO), GABAPENTIN, OXYCODONE IF NEEDED                                You may not have any metal on your body including hair pins and              piercings  Do not wear jewelry, make-up, lotions, powders or perfumes, deodorant             Do not wear nail polish.  Do not shave  48 hours prior to surgery.     Do not bring valuables to the hospital. [place].  Contacts, dentures or bridgework may not be worn into surgery.      Patients discharged the day of surgery will not be allowed to drive home.  Name and phone number of your driver:  Special Instructions: N/A              Please read over the following fact sheets you were given: _____________________________________________________________________             How to Manage Your Diabetes Before and After Surgery  Why is it important to control my blood sugar before and after surgery? . Improving blood sugar levels before and after surgery helps healing and can limit problems. . A way of improving blood sugar control is eating a healthy diet by: o  Eating less sugar and carbohydrates o  Increasing activity/exercise o  Talking with your doctor about reaching your blood sugar goals . High blood sugars (greater than 180 mg/dL) can raise your risk of infections and slow your recovery, so you will need to focus on controlling your diabetes during the weeks before surgery. . Make sure that the doctor who takes care of your diabetes knows about your  planned surgery including the date and location.  How do I manage my blood sugar before surgery? . Check your blood sugar at least 4 times a day, starting 2 days before surgery, to make sure that the level is not too high or low. o Check your blood sugar the morning of your surgery when you wake up and every 2 hours until you get to the Short Stay unit. . If your blood sugar is less than 70 mg/dL, you will need to treat for low blood sugar: o Do not take insulin. o Treat a low blood sugar (less than 70 mg/dL) with  cup of clear juice (cranberry or apple), 4 glucose tablets, OR glucose gel. o Recheck blood sugar in 15 minutes after treatment (to make sure it is greater than 70 mg/dL). If your blood sugar is not greater than 70 mg/dL on recheck, call [phone] for further instructions. . Report your blood sugar to the short stay nurse when you get to Short Stay.  . If  you are admitted to the hospital after surgery: o Your blood sugar will be checked by the staff and you will probably be given insulin after surgery (instead of oral diabetes medicines) to make sure you have good blood sugar levels. o The goal for blood sugar control after surgery is 80-180 mg/dL.   WHAT DO I DO ABOUT MY DIABETES MEDICATION?      . THE MORNING OF SURGERY, DO NOT TAKE ANY DIABETIC MEDICATIONS!   Patient Signature:  Date:   Nurse Signature:  Date:   Reviewed and Endorsed by [facility] Patient Education Committee, August 2015     [facility] - Preparing for Surgery Before surgery, you can play an important role.  Because skin is not sterile, your skin needs to be as free of germs as possible.  You can reduce the number of germs on your skin by washing with CHG (chlorahexidine gluconate) soap before surgery.  CHG is an antiseptic cleaner which kills germs and bonds with the skin to continue killing germs even after washing. Please DO NOT use if you have an allergy to CHG or antibacterial soaps.  If your  skin becomes reddened/irritated stop using the CHG and inform your nurse when you arrive at Short Stay. Do not shave (including legs and underarms) for at least 48 hours prior to the first CHG shower.  You may shave your face/neck. Please follow these instructions carefully:  1.  Shower with CHG Soap the night before surgery and the  morning of Surgery.  2.  If you choose to wash your hair, wash your hair first as usual with your  normal  shampoo.  3.  After you shampoo, rinse your hair and body thoroughly to remove the  shampoo.                           4.  Use CHG as you would any other liquid soap.  You can apply chg directly  to the skin and wash                       Gently with a scrungie or clean washcloth.  5.  Apply the CHG Soap to your body ONLY FROM THE NECK DOWN.   Do not use on face/ open                           Wound or open sores. Avoid contact with eyes, ears mouth and genitals (private parts).                       Wash face,  Genitals (private parts) with your normal soap.             6.  Wash thoroughly, paying special attention to the area where your surgery  will be performed.  7.  Thoroughly rinse your body with warm water from the neck down.  8.  DO NOT shower/wash with your normal soap after using and rinsing off  the CHG Soap.                9.  Pat yourself dry with a clean towel.            10.  Wear clean pajamas.            11.  Place clean sheets on your bed the night of your first shower  and do not  sleep with pets. Day of Surgery : Do not apply any lotions/deodorants the morning of surgery.  Please wear clean clothes to the hospital/surgery center.  FAILURE TO FOLLOW THESE INSTRUCTIONS MAY RESULT IN THE CANCELLATION OF YOUR SURGERY PATIENT SIGNATURE_________________________________  NURSE SIGNATURE__________________________________  ________________________________________________________________________

## 2017-09-24 ENCOUNTER — Encounter (HOSPITAL_COMMUNITY)
Admission: RE | Admit: 2017-09-24 | Discharge: 2017-09-24 | Disposition: A | Discharge status: Home / Self Care | Payer: Medicare HMO | Source: Ambulatory Visit | Attending: Urology | Admitting: Urology

## 2017-09-24 ENCOUNTER — Other Ambulatory Visit: Payer: Self-pay

## 2017-09-24 ENCOUNTER — Encounter (HOSPITAL_COMMUNITY): Payer: Self-pay

## 2017-09-24 DIAGNOSIS — F419 Anxiety disorder, unspecified: Secondary | ICD-10-CM | POA: Diagnosis not present

## 2017-09-24 DIAGNOSIS — F17200 Nicotine dependence, unspecified, uncomplicated: Secondary | ICD-10-CM | POA: Diagnosis not present

## 2017-09-24 DIAGNOSIS — C678 Malignant neoplasm of overlapping sites of bladder: Secondary | ICD-10-CM | POA: Diagnosis not present

## 2017-09-24 DIAGNOSIS — Z791 Long term (current) use of non-steroidal anti-inflammatories (NSAID): Secondary | ICD-10-CM | POA: Diagnosis not present

## 2017-09-24 DIAGNOSIS — E119 Type 2 diabetes mellitus without complications: Secondary | ICD-10-CM | POA: Diagnosis not present

## 2017-09-24 DIAGNOSIS — Z7982 Long term (current) use of aspirin: Secondary | ICD-10-CM | POA: Diagnosis not present

## 2017-09-24 DIAGNOSIS — R8271 Bacteriuria: Secondary | ICD-10-CM | POA: Diagnosis not present

## 2017-09-24 DIAGNOSIS — I10 Essential (primary) hypertension: Secondary | ICD-10-CM | POA: Diagnosis not present

## 2017-09-24 DIAGNOSIS — Z79899 Other long term (current) drug therapy: Secondary | ICD-10-CM | POA: Diagnosis not present

## 2017-09-24 DIAGNOSIS — Z7984 Long term (current) use of oral hypoglycemic drugs: Secondary | ICD-10-CM | POA: Diagnosis not present

## 2017-09-24 LAB — HEMOGLOBIN A1C
Hgb A1c MFr Bld: 7.5 % — ABNORMAL HIGH (ref 4.8–5.6)
MEAN PLASMA GLUCOSE: 168.55 mg/dL

## 2017-09-24 LAB — CBC
HEMATOCRIT: 38.7 % (ref 36.0–46.0)
HEMOGLOBIN: 12.7 g/dL (ref 12.0–15.0)
MCH: 28.5 pg (ref 26.0–34.0)
MCHC: 32.8 g/dL (ref 30.0–36.0)
MCV: 87 fL (ref 78.0–100.0)
Platelets: 244 10*3/uL (ref 150–400)
RBC: 4.45 MIL/uL (ref 3.87–5.11)
RDW: 13.2 % (ref 11.5–15.5)
WBC: 11 10*3/uL — ABNORMAL HIGH (ref 4.0–10.5)

## 2017-09-24 LAB — BASIC METABOLIC PANEL
Anion gap: 14 (ref 5–15)
BUN: 41 mg/dL — ABNORMAL HIGH (ref 6–20)
CO2: 23 mmol/L (ref 22–32)
Calcium: 9.8 mg/dL (ref 8.9–10.3)
Chloride: 102 mmol/L (ref 101–111)
Creatinine, Ser: 1.73 mg/dL — ABNORMAL HIGH (ref 0.44–1.00)
GFR calc Af Amer: 34 mL/min — ABNORMAL LOW (ref >60)
GFR, EST NON AFRICAN AMERICAN: 29 mL/min — AB (ref >60)
GLUCOSE: 206 mg/dL — AB (ref 65–99)
POTASSIUM: 4.4 mmol/L (ref 3.5–5.1)
SODIUM: 139 mmol/L (ref 135–145)

## 2017-09-24 LAB — SURGICAL PCR SCREEN
MRSA, PCR: NEGATIVE
STAPHYLOCOCCUS AUREUS: NEGATIVE

## 2017-09-24 LAB — GLUCOSE, CAPILLARY: Glucose-Capillary: 239 mg/dL — ABNORMAL HIGH (ref 65–99)

## 2017-09-24 NOTE — Progress Notes (Signed)
Bmp routed via epic to dr Wynetta Emery borden

## 2017-09-24 NOTE — H&P (Signed)
Office Visit Report     09/15/2017   --------------------------------------------------------------------------------   Jill Fox  MRN: [id number]  PRIMARY CARE:  Jill Riches, NP  DOB: [birth year], 67 year old Female  REFERRING:  Jill Penna, MD  SSN: [id number]  PROVIDER:  Raynelle Fox, M.D.    LOCATION:  [facility] [phone]   --------------------------------------------------------------------------------   CC/HPI: Bladder cancer   Jill Fox returns today for surveillance of her recurrent low-grade urothelial carcinoma the bladder. She denies any recent hematuria or change in voiding habits. At her last cystoscopy, she did have some suspicious raised erythematous areas. Considering her history of low-grade disease only, we have monitor these. She follows up today for surveillance cystoscopy.     ALLERGIES: No Allergies    MEDICATIONS: Alprazolam 0.5 mg tablet Oral  Amitriptyline Hcl  Aspirin 325 MG Oral Tablet Oral  Atenolol 25 mg tablet Oral  Clonazepam 1 mg tablet Oral  Escitalopram Oxalate 5 mg tablet Oral  Gabapentin 300 mg capsule Oral  Glipizide 10 mg tablet Oral  Ibuprofen 600 mg tablet Oral  Lexapro 10 mg tablet Oral  Lisinopril-Hydrochlorothiazide 20-25 MG Oral Tablet Oral  Nystatin 100,000 unit/gram cream External  Pravastatin Sodium 40 mg tablet Oral  Victoza 3-Pak 0.6 mg/0.1 ml (18 mg/3 ml) pen injector Subcutaneous     GU PSH: Cysto Bladder Ureth Biopsy - 2011 Cystoscopy - 06/15/2017, 03/16/2017, 12/04/2016, 03/27/2016 Cystoscopy Insert Stent - 2012 Cystoscopy TURBT <2 cm - 07/23/2016, 2014, 2013 Cystoscopy TURBT 2-5 cm - 2016      PSH Notes: Cystoscopy With Fulguration Medium Lesion (2-5cm), Cystoscopy With Fulguration Small Lesion (5-32mm), Cystoscopy With Fulguration Small Lesion (5-10mm), Cystoscopy With Insertion Of Ureteral Stent Left, Closure Of Enterovesical Fistula, Cystoscopy With Biopsy, Vesicoureteral  Reimplantation, Partial Colectomy, Tubal Ligation   NON-GU PSH: Partial colectomy - 2011 Repair Bowel-bladder Fistula - 2012 Tubal Ligation - 2011    GU PMH: Bladder Cancer overlapping sites, Malignant neoplasm of overlapping sites of bladder - 2017 Urinary Tract Inf, Unspec site, Pyuria - 2015, Urinary tract infection, - 2014 History of bladder cancer, Bladder Cancer - 2014 Mixed incontinence, Urge and stress incontinence - 2014 Nocturia, Nocturia - 2014 Urinary Frequency, Increased urinary frequency - 2014 Vesicointestinal fistula, Colovesical fistula - 2014      PMH Notes:   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 She is s/p repair by Jill Fox and myself on 05/20/10.   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   Sep 2010: TURBT at [facility] (low grade, Ta)  Jul 2011: TURBT by Jill Fox (low grade Ta)  Jan 2013: TURBT (low grade Ta, multiple tumors)  Mar 2014: TURBT (low grade Ta, multiple tumors)  Apr 2016: TURBT (low grade Ta, multiple tumors)  Mar 2018: TURBT (low grade Ta, multiple tumors)   Last upper tract imaging: Mar 2018 ([facility])     NON-GU PMH: Bacteriuria - 03/16/2017 Diabetes Type 2 Diverticulitis Hypertension    FAMILY HISTORY: Bladder Cancer - No Family History Heart Disease - Father Kidney Cancer - No Family History   SOCIAL HISTORY: Marital Status: Single Preferred Language: English; Ethnicity: Not Hispanic Or Latino; Race: White Current Smoking Status: Patient smokes.  Does drink.  Drinks 1 caffeinated drink per day.     Notes: Current every day smoker, Marital History - Single, Tobacco Use,  Alcohol Use, Occupation:   REVIEW OF SYSTEMS:    GU Review Female:   Patient denies frequent urination, hard to postpone urination, burning /pain with urination, get up at night to  urinate, leakage of urine, stream starts and stops, trouble starting your stream, have to strain to urinate, and currently pregnant.  Gastrointestinal (Upper):   Patient denies nausea and vomiting.  Gastrointestinal (Lower):   Patient denies diarrhea and constipation.  Constitutional:   Patient denies fever, night sweats, weight loss, and fatigue.  Skin:   Patient denies skin rash/ lesion and itching.  Eyes:   Patient denies blurred vision and double vision.  Ears/ Nose/ Throat:   Patient denies sore throat and sinus problems.  Hematologic/Lymphatic:   Patient denies swollen glands and easy bruising.  Cardiovascular:   Patient denies leg swelling and chest pains.  Respiratory:   Patient denies cough and shortness of breath.  Endocrine:   Patient denies excessive thirst.  Musculoskeletal:   Patient denies back pain and joint pain.  Neurological:   Patient denies headaches and dizziness.  Psychologic:   Patient denies depression and anxiety.   VITAL SIGNS:      09/15/2017 03:10 PM  Weight 162 lb / 73.48 kg  Height 60 in / 152.4 cm  BP 99/64 mmHg  Pulse 86 /min  BMI 31.6 kg/m   GU PHYSICAL EXAMINATION:    Urethral Meatus: Normal size. Normal position. No discharge.  Urethra: No tenderness, no mass, no scarring. No hypermobility. No leakage.   MULTI-SYSTEM PHYSICAL EXAMINATION:    Constitutional: Well-nourished. No physical deformities. Normally developed. Good grooming.  Respiratory: No labored breathing, no use of accessory muscles. Normal breath sounds.  Cardiovascular: Regular rate and rhythm. No murmur, no gallop. Normal temperature, normal extremity pulses, no swelling, no varicosities.     PAST DATA REVIEWED:  Source Of History:  Patient  Urine Test Review:   Urinalysis  Notes:                     Urine will be cultured.   PROCEDURES:         Flexible Cystoscopy - 52000  Risks, benefits, and some of the potential complications of the procedure were discussed at length with  the patient including infection, bleeding, voiding discomfort, urinary retention, fever, chills, sepsis, and others. All questions were answered. Informed consent was obtained. Antibiotic prophylaxis was given. Sterile technique and intraurethral analgesia were used.  Meatus:  Normal size. Normal location. Normal condition.  Urethra:  No hypermobility. No leakage.  Ureteral Orifices:  Normal location. Normal size. Normal shape. Effluxed clear urine.  Bladder:  Systematic examination of bladder was performed. This revealed her reimplanted left ureter on the left side of the bladder laterally. Around her new ureteral orifice, there are 2 small papillary appearing tumors that appeared to be forming that due look consistent with low-grade disease. Toward the midline posteriorly, there are also 2 other areas that raise suspicion for recurrent tumors. Finally, she does have a raised erythematous area on the right lateral bladder wall that also raises concern for possible low-grade recurrence.      Chaperone: AJ The lower urinary tract was carefully examined. The procedure was well-tolerated and without complications. Antibiotic instructions were given. Instructions were given to call the office immediately for bloody urine, difficulty urinating, urinary retention, painful or frequent urination, fever, chills, nausea, vomiting or other illness. The patient stated that she understood these instructions and would comply with them.  Urinalysis Dipstick Dipstick Cont'd Micro  Color: Yellow Bilirubin: Neg WBC/hpf: 10 - 20/hpf  Appearance: Cloudy Ketones: Trace RBC/hpf: NS (Not Seen)  Specific Gravity: 1.030 Blood: Neg Bacteria: Mod (26-50/hpf)  pH: <=5.0 Protein: 1+ Cystals: NS (Not Seen)  Glucose: Neg Urobilinogen: 1.0 Casts: NS (Not Seen)    Nitrites: Neg Trichomonas: Not Present    Leukocyte Esterase: Trace Mucous: Not Present      Epithelial Cells: 20 - 40/hpf      Yeast: NS (Not Seen)       Sperm: Not Present    Notes: confirmed by repeat analysis    ASSESSMENT:      ICD-10 Details  1 GU:   Bladder Cancer overlapping sites - C67.8   2 NON-GU:   Bacteriuria - R82.71 Stable   PLAN:           Orders Labs Urine Culture          Schedule Return Visit/Planned Activity: Other See Visit Notes             Note: Will call to schedule surgery          Document Letter(s):  Created for Patient: Clinical Summary         Notes:   1. Intermediate risk non muscle invasive bladder cancer: She does appear to have clear recurrence at this point. We discussed her cystoscopic findings today. I have recommended she proceed with cystoscopy under anesthesia, exam under anesthesia, and transurethral resection of her bladder tumors. Considering her multiple recurrences, we discussed the potential benefits of blue light cystoscopy and we will try to arrange this to be done this month while we are trialing that technology. We discussed that in detail today. In addition, we reviewed the potential risks, complications, and the expected recovery process associated with cystoscopy and transurethral resection of the bladder. She gives her informed consent. This will be scheduled. Her urine has been cultured today.   Cc: Heide Scales, NP    * Signed by Jill Fox, M.D. on 09/15/17 at 7:53 PM (EDT*

## 2017-09-27 ENCOUNTER — Other Ambulatory Visit: Payer: Self-pay

## 2017-09-27 ENCOUNTER — Ambulatory Visit (HOSPITAL_COMMUNITY): Payer: Medicare HMO | Admitting: Certified Registered Nurse Anesthetist

## 2017-09-27 ENCOUNTER — Encounter (HOSPITAL_COMMUNITY): Payer: Self-pay | Admitting: *Deleted

## 2017-09-27 ENCOUNTER — Ambulatory Visit (HOSPITAL_COMMUNITY)
Admission: RE | Admit: 2017-09-27 | Discharge: 2017-09-27 | Disposition: A | Discharge status: Home / Self Care | Payer: Medicare HMO | Source: Ambulatory Visit | Attending: Urology | Admitting: Urology

## 2017-09-27 ENCOUNTER — Encounter (HOSPITAL_COMMUNITY)
Admission: RE | Disposition: A | Discharge status: Home / Self Care | Payer: Self-pay | Source: Ambulatory Visit | Attending: Urology

## 2017-09-27 DIAGNOSIS — R8271 Bacteriuria: Secondary | ICD-10-CM | POA: Insufficient documentation

## 2017-09-27 DIAGNOSIS — Z79899 Other long term (current) drug therapy: Secondary | ICD-10-CM | POA: Insufficient documentation

## 2017-09-27 DIAGNOSIS — Z7982 Long term (current) use of aspirin: Secondary | ICD-10-CM | POA: Insufficient documentation

## 2017-09-27 DIAGNOSIS — E119 Type 2 diabetes mellitus without complications: Secondary | ICD-10-CM | POA: Diagnosis not present

## 2017-09-27 DIAGNOSIS — I10 Essential (primary) hypertension: Secondary | ICD-10-CM | POA: Diagnosis not present

## 2017-09-27 DIAGNOSIS — C678 Malignant neoplasm of overlapping sites of bladder: Secondary | ICD-10-CM | POA: Insufficient documentation

## 2017-09-27 DIAGNOSIS — Z791 Long term (current) use of non-steroidal anti-inflammatories (NSAID): Secondary | ICD-10-CM | POA: Insufficient documentation

## 2017-09-27 DIAGNOSIS — F17200 Nicotine dependence, unspecified, uncomplicated: Secondary | ICD-10-CM | POA: Insufficient documentation

## 2017-09-27 DIAGNOSIS — F419 Anxiety disorder, unspecified: Secondary | ICD-10-CM | POA: Insufficient documentation

## 2017-09-27 DIAGNOSIS — Z7984 Long term (current) use of oral hypoglycemic drugs: Secondary | ICD-10-CM | POA: Insufficient documentation

## 2017-09-27 HISTORY — PX: TRANSURETHRAL RESECTION OF BLADDER TUMOR: SHX2575

## 2017-09-27 HISTORY — PX: CYSTOSCOPY: SHX5120

## 2017-09-27 LAB — GLUCOSE, CAPILLARY: Glucose-Capillary: 151 mg/dL — ABNORMAL HIGH (ref 65–99)

## 2017-09-27 SURGERY — CYSTOSCOPY
Anesthesia: General

## 2017-09-27 MED ORDER — STERILE WATER FOR IRRIGATION IR SOLN
Status: DC | PRN
  Administered 2017-09-27: 500 mL

## 2017-09-27 MED ORDER — PROPOFOL 10 MG/ML IV BOLUS
INTRAVENOUS | Status: AC
  Filled 2017-09-27: qty 20

## 2017-09-27 MED ORDER — MIDAZOLAM HCL 2 MG/2ML IJ SOLN
INTRAMUSCULAR | Status: AC
  Filled 2017-09-27: qty 2

## 2017-09-27 MED ORDER — EPHEDRINE SULFATE-NACL 50-0.9 MG/10ML-% IV SOSY
PREFILLED_SYRINGE | INTRAVENOUS | Status: DC | PRN
  Administered 2017-09-27: 5 mg via INTRAVENOUS

## 2017-09-27 MED ORDER — PHENAZOPYRIDINE HCL 100 MG PO TABS: 100.0000 mg | ORAL_TABLET | Freq: Three times a day (TID) | ORAL | 0 refills | Status: DC | PRN

## 2017-09-27 MED ORDER — PHENYLEPHRINE HCL 10 MG/ML IJ SOLN
INTRAMUSCULAR | Status: AC
  Filled 2017-09-27: qty 2

## 2017-09-27 MED ORDER — DEXAMETHASONE SODIUM PHOSPHATE 10 MG/ML IJ SOLN
INTRAMUSCULAR | Status: DC | PRN
  Administered 2017-09-27: 4 mg via INTRAVENOUS

## 2017-09-27 MED ORDER — ONDANSETRON HCL 4 MG/2ML IJ SOLN: 4.0000 mg | Freq: Once | INTRAMUSCULAR | Status: DC | PRN

## 2017-09-27 MED ORDER — CEFAZOLIN SODIUM-DEXTROSE 2-4 GM/100ML-% IV SOLN
2.0000 g | Freq: Once | INTRAVENOUS | Status: AC
  Administered 2017-09-27: 2 g via INTRAVENOUS
  Filled 2017-09-27: qty 100

## 2017-09-27 MED ORDER — PHENYLEPHRINE 40 MCG/ML (10ML) SYRINGE FOR IV PUSH (FOR BLOOD PRESSURE SUPPORT)
PREFILLED_SYRINGE | INTRAVENOUS | Status: DC | PRN
  Administered 2017-09-27: 80 ug via INTRAVENOUS
  Administered 2017-09-27: 40 ug via INTRAVENOUS
  Administered 2017-09-27 (×2): 80 ug via INTRAVENOUS
  Administered 2017-09-27: 120 ug via INTRAVENOUS

## 2017-09-27 MED ORDER — FENTANYL CITRATE (PF) 250 MCG/5ML IJ SOLN
INTRAMUSCULAR | Status: DC | PRN
  Administered 2017-09-27: 100 ug via INTRAVENOUS

## 2017-09-27 MED ORDER — LIDOCAINE 2% (20 MG/ML) 5 ML SYRINGE
INTRAMUSCULAR | Status: DC | PRN
  Administered 2017-09-27: 60 mg via INTRAVENOUS

## 2017-09-27 MED ORDER — FENTANYL CITRATE (PF) 250 MCG/5ML IJ SOLN
INTRAMUSCULAR | Status: AC
  Filled 2017-09-27: qty 5

## 2017-09-27 MED ORDER — SODIUM CHLORIDE 0.9 % IR SOLN
Status: DC | PRN
  Administered 2017-09-27: 6000 mL

## 2017-09-27 MED ORDER — DEXAMETHASONE SODIUM PHOSPHATE 10 MG/ML IJ SOLN
INTRAMUSCULAR | Status: AC
  Filled 2017-09-27: qty 1

## 2017-09-27 MED ORDER — ROCURONIUM BROMIDE 10 MG/ML (PF) SYRINGE
PREFILLED_SYRINGE | INTRAVENOUS | Status: DC | PRN
  Administered 2017-09-27 (×2): 20 mg via INTRAVENOUS
  Administered 2017-09-27: 30 mg via INTRAVENOUS

## 2017-09-27 MED ORDER — ALBUTEROL SULFATE HFA 108 (90 BASE) MCG/ACT IN AERS
INHALATION_SPRAY | RESPIRATORY_TRACT | Status: AC
  Filled 2017-09-27: qty 6.7

## 2017-09-27 MED ORDER — ONDANSETRON HCL 4 MG/2ML IJ SOLN
INTRAMUSCULAR | Status: DC | PRN
  Administered 2017-09-27: 4 mg via INTRAVENOUS

## 2017-09-27 MED ORDER — PROPOFOL 10 MG/ML IV BOLUS
INTRAVENOUS | Status: DC | PRN
  Administered 2017-09-27: 120 mg via INTRAVENOUS

## 2017-09-27 MED ORDER — SODIUM CHLORIDE 0.9 % IV SOLN
INTRAVENOUS | Status: DC
  Administered 2017-09-27: 06:00:00 via INTRAVENOUS

## 2017-09-27 MED ORDER — FENTANYL CITRATE (PF) 100 MCG/2ML IJ SOLN: 25.0000 ug | INTRAMUSCULAR | Status: DC | PRN

## 2017-09-27 MED ORDER — ONDANSETRON HCL 4 MG/2ML IJ SOLN
INTRAMUSCULAR | Status: AC
  Filled 2017-09-27: qty 2

## 2017-09-27 MED ORDER — ALBUTEROL SULFATE HFA 108 (90 BASE) MCG/ACT IN AERS
INHALATION_SPRAY | RESPIRATORY_TRACT | Status: DC | PRN
  Administered 2017-09-27: 4 via RESPIRATORY_TRACT

## 2017-09-27 MED ORDER — SUGAMMADEX SODIUM 500 MG/5ML IV SOLN
INTRAVENOUS | Status: AC
  Filled 2017-09-27: qty 5

## 2017-09-27 MED ORDER — ROCURONIUM BROMIDE 10 MG/ML (PF) SYRINGE
PREFILLED_SYRINGE | INTRAVENOUS | Status: AC
  Filled 2017-09-27: qty 5

## 2017-09-27 MED ORDER — SUGAMMADEX SODIUM 200 MG/2ML IV SOLN
INTRAVENOUS | Status: AC
  Filled 2017-09-27: qty 2

## 2017-09-27 MED ORDER — MIDAZOLAM HCL 5 MG/5ML IJ SOLN
INTRAMUSCULAR | Status: DC | PRN
  Administered 2017-09-27: 2 mg via INTRAVENOUS

## 2017-09-27 MED ORDER — SUGAMMADEX SODIUM 200 MG/2ML IV SOLN
INTRAVENOUS | Status: DC | PRN
  Administered 2017-09-27: 500 mg via INTRAVENOUS

## 2017-09-27 MED ORDER — PHENYLEPHRINE 40 MCG/ML (10ML) SYRINGE FOR IV PUSH (FOR BLOOD PRESSURE SUPPORT)
PREFILLED_SYRINGE | INTRAVENOUS | Status: AC
  Filled 2017-09-27: qty 10

## 2017-09-27 MED ORDER — LIDOCAINE 2% (20 MG/ML) 5 ML SYRINGE
INTRAMUSCULAR | Status: AC
  Filled 2017-09-27: qty 5

## 2017-09-27 SURGICAL SUPPLY — 21 items
BAG URINE DRAINAGE (UROLOGICAL SUPPLIES) ×3 IMPLANT
BAG URO CATCHER STRL LF (MISCELLANEOUS) ×3 IMPLANT
CATH FOLEY 2WAY SLVR  5CC 16FR (CATHETERS) ×2
CATH FOLEY 2WAY SLVR 5CC 16FR (CATHETERS) ×1 IMPLANT
CATH INTERMIT  6FR 70CM (CATHETERS) IMPLANT
CLOTH BEACON ORANGE TIMEOUT ST (SAFETY) ×3 IMPLANT
COVER FOOTSWITCH UNIV (MISCELLANEOUS) IMPLANT
COVER SURGICAL LIGHT HANDLE (MISCELLANEOUS) IMPLANT
CYSVIEW 100 KIT (MISCELLANEOUS) ×3 IMPLANT
ELECT REM PT RETURN 15FT ADLT (MISCELLANEOUS) ×3 IMPLANT
GLOVE BIOGEL M STRL SZ7.5 (GLOVE) ×3 IMPLANT
GOWN STRL REUS W/TWL LRG LVL3 (GOWN DISPOSABLE) ×6 IMPLANT
GUIDEWIRE STR DUAL SENSOR (WIRE) IMPLANT
LOOP CUT BIPOLAR 24F LRG (ELECTROSURGICAL) IMPLANT
MANIFOLD NEPTUNE II (INSTRUMENTS) ×3 IMPLANT
PACK CYSTO (CUSTOM PROCEDURE TRAY) ×3 IMPLANT
SET ASPIRATION TUBING (TUBING) IMPLANT
SYRINGE IRR TOOMEY STRL 70CC (SYRINGE) IMPLANT
TUBING CONNECTING 10 (TUBING) ×2 IMPLANT
TUBING CONNECTING 10' (TUBING) ×1
TUBING UROLOGY SET (TUBING) ×3 IMPLANT

## 2017-09-27 NOTE — Anesthesia Postprocedure Evaluation (Signed)
Anesthesia Post Note  Patient: Jill Fox  Procedure(s) Performed: BLUE LIGHT CYSTOSCOPY WITH CYSVIEW (N/A ) TRANSURETHRAL RESECTION OF BLADDER TUMOR (TURBT) (N/A )     Patient location during evaluation: PACU Anesthesia Type: General Level of consciousness: awake and alert Pain management: pain level controlled Vital Signs Assessment: post-procedure vital signs reviewed and stable Respiratory status: spontaneous breathing, nonlabored ventilation and respiratory function stable Cardiovascular status: blood pressure returned to baseline and stable Postop Assessment: no apparent nausea or vomiting Anesthetic complications: no    Last Vitals:  Vitals:   09/27/17 0945 09/27/17 1013  BP: (!) 113/49 105/74  Pulse: 87 84  Resp: 18 19  Temp:  36.8 C  SpO2: 97% 91%    Last Pain:  Vitals:   09/27/17 1013  TempSrc:   PainSc: 0-No pain                 Catalina Gravel

## 2017-09-27 NOTE — Anesthesia Procedure Notes (Signed)
Procedure Name: Intubation Date/Time: 09/27/2017 7:31 AM Performed by: Mitzie Na, CRNA Pre-anesthesia Checklist: Patient identified, Emergency Drugs available, Suction available, Patient being monitored and Timeout performed Patient Re-evaluated:Patient Re-evaluated prior to induction Oxygen Delivery Method: Circle system utilized Preoxygenation: Pre-oxygenation with 100% oxygen Induction Type: IV induction Ventilation: Mask ventilation without difficulty and Oral airway inserted - appropriate to patient size Laryngoscope Size: Mac and 3 Grade View: Grade I Tube type: Oral Tube size: 7.0 mm Number of attempts: 1 Placement Confirmation: ETT inserted through vocal cords under direct vision and positive ETCO2 Secured at: 22 cm Tube secured with: Tape Dental Injury: Teeth and Oropharynx as per pre-operative assessment

## 2017-09-27 NOTE — Progress Notes (Signed)
Pt to be discharged home with catheter, may have leg bag. Dr Lynne Logan office will with follow up appt. Per Dr Alinda Money

## 2017-09-27 NOTE — Transfer of Care (Signed)
Immediate Anesthesia Transfer of Care Note  Patient: Jill Fox  Procedure(s) Performed: BLUE LIGHT CYSTOSCOPY WITH CYSVIEW (N/A ) TRANSURETHRAL RESECTION OF BLADDER TUMOR (TURBT) (N/A )  Patient Location: PACU  Anesthesia Type:General  Level of Consciousness: awake, alert  and oriented  Airway & Oxygen Therapy: Patient Spontanous Breathing and Patient connected to face mask oxygen  Post-op Assessment: Report given to RN and Post -op Vital signs reviewed and stable  Post vital signs: Reviewed and stable  Last Vitals:  Vitals Value Taken Time  BP 78/56 09/27/2017  8:38 AM  Temp    Pulse 89 09/27/2017  8:39 AM  Resp 17 09/27/2017  8:39 AM  SpO2 100 % 09/27/2017  8:39 AM  Vitals shown include unvalidated device data.  Last Pain:  Vitals:   09/27/17 0545  TempSrc: Oral      Patients Stated Pain Goal: 3 (76/19/50 9326)  Complications: No apparent anesthesia complications

## 2017-09-27 NOTE — Discharge Instructions (Addendum)

## 2017-09-27 NOTE — Interval H&P Note (Signed)
History and Physical Interval Note:  09/27/2017 6:57 AM  Jill Fox  has presented today for surgery, with the diagnosis of BLADDER CANCER  The various methods of treatment have been discussed with the patient and family. After consideration of risks, benefits and other options for treatment, the patient has consented to  Procedure(s) with comments: BLUE LIGHT CYSTOSCOPY WITH CYSVIEW (N/A) - GENERAL ANESTHESIA WITH PARALYSIS TRANSURETHRAL RESECTION OF BLADDER TUMOR (TURBT) (N/A) - GENERAL ANESTHESIA WITH PARALYSIS as a surgical intervention .  The patient's history has been reviewed, patient examined, no change in status, stable for surgery.  I have reviewed the patient's chart and labs.  Questions were answered to the patient's satisfaction.     [name],LES

## 2017-09-27 NOTE — Anesthesia Preprocedure Evaluation (Addendum)
Anesthesia Evaluation  Patient identified by MRN, date of birth, ID band Patient awake    Reviewed: Allergy & Precautions, NPO status , Patient's Chart, lab work & pertinent test results, reviewed documented beta blocker date and time   Airway Mallampati: I  TM Distance: >3 FB Neck ROM: Full    Dental  (+) Teeth Intact, Dental Advisory Given, Poor Dentition   Pulmonary Current Smoker (SMOKED ON DOS),    Pulmonary exam normal breath sounds clear to auscultation       Cardiovascular hypertension, Pt. on home beta blockers and Pt. on medications Normal cardiovascular exam Rhythm:Regular Rate:Normal     Neuro/Psych PSYCHIATRIC DISORDERS Anxiety negative neurological ROS     GI/Hepatic negative GI ROS, Neg liver ROS,   Endo/Other  diabetes, Type 2, Oral Hypoglycemic AgentsObesity   Renal/GU Renal InsufficiencyRenal disease   Bladder cancer     Musculoskeletal  (+) Arthritis , Osteoarthritis,    Abdominal   Peds  Hematology negative hematology ROS (+)   Anesthesia Other Findings Day of surgery medications reviewed with the patient.  Reproductive/Obstetrics                           Anesthesia Physical Anesthesia Plan  ASA: III  Anesthesia Plan: General   Post-op Pain Management:    Induction: Intravenous  PONV Risk Score and Plan: 3 and Midazolam, Dexamethasone and Ondansetron  Airway Management Planned: Oral ETT  Additional Equipment:   Intra-op Plan:   Post-operative Plan: Extubation in OR  Informed Consent: I have reviewed the patients History and Physical, chart, labs and discussed the procedure including the risks, benefits and alternatives for the proposed anesthesia with the patient or authorized representative who has indicated his/her understanding and acceptance.   Dental advisory given  Plan Discussed with: CRNA  Anesthesia Plan Comments:         Anesthesia  Quick Evaluation

## 2017-09-27 NOTE — Op Note (Signed)
Preoperative diagnosis: Urothelial carcinoma of the bladder  Postoperative diagnosis: Urothelial carcinoma of the bladder  Procedures: 1.  Cystoscopy with bluelight 2.  Pelvic exam under anesthesia 3.  Transurethral resection of bladder tumor (multiple tumors with the largest measuring 2.5 cm)  Surgeon: Roxy Horseman, Brooke Bonito MD  Anesthesia: General  EBL: Minimal  Complications: None  Specimens: 1. Tumor at trigone 2. Tumor at bladder dome 3. Tumor at right lateral wall  Intraoperative findings: There were noted to be multiple small, low-grade appearing tumors throughout the bladder including 2 small papillary tumors near the reimplanted left ureteral orifice.  There were 2 additional tumors located on the anterior surface of the bladder and a larger 2.5 cm tumor off the right lateral bladder wall.  No other obvious abnormalities were noted on white light cystoscopy.  Utilizing bluelight, the bladder was reexamined and there were a couple of other small less than 1 cm suspicious tumors that were identified toward the dome of the bladder, left lateral wall.  In addition, there was an area along the trigone of the bladder that raised suspicion.  Indication: Jill Fox is a 67 year old female with a history of low-grade urothelial carcinoma of the bladder.  She presents today after having been noted to have multiple tumor recurrences within the bladder on office cystoscopy.  After reviewing these findings, it was recommended that she undergo the above procedures.  Potential risks, complications, and the expected recovery process was discussed in detail.  Considering her multiple recurrences and multifocal disease, we discussed proceeding with bluelight cystoscopy for additional benefit.  She gave informed consent.  Description of procedure: The patient was taken to the operating room and a general anesthetic was administered.  She was administered paralysis.  She was given preoperative  antibiotics, placed in the dorsolithotomy position, and prepped and draped in usual sterile fashion.  A preoperative timeout was performed.  A pelvic exam under anesthesia was performed.  No pelvic masses were noted.  Cystourethroscopy was then performed with both a 30 and 70 degree lens with white light cystoscopy.  Findings are as noted above.  The patient had been administered Cysview preoperatively.  Bluelight cystoscopy was then utilized to further identify additional suspicious areas for tumor.  Utilizing the 26 French resectoscope, bipolar loop resection was then performed of the previously mentioned suspicious areas.  Separate specimens were obtained from the trigone of the bladder, right lateral bladder wall, and areas toward the dome of the bladder.  Considering the fact that the patient had only a history of low-grade disease and had no further suspicious lesions, additional biopsies of the base of these tumors sites were not obtained.  However, resection did seem to be deep enough for adequate staging.  In addition, there were multiple areas with the bladder appeared quite thin.  For this reason, after complete resection and fulguration of any other suspicious areas, a 48 French Foley catheter was inserted at the end of the procedure.  She tolerated the procedure well and without complications.  She was able to be extubated and transferred to the PACU in stable condition. She was not administered intravesical chemotherapy postoperatively due to her refluxing left ureteral orifice.

## 2017-09-28 ENCOUNTER — Encounter (HOSPITAL_COMMUNITY): Payer: Self-pay | Admitting: Urology

## 2019-01-20 IMAGING — DX DG CHEST 2V
2 series · 2 of 2 positions shown · non-contrast
Comparison: 08/22/2014 chest radiograph.

CLINICAL DATA: Preoperative for bladder cancer surgery

EXAM:
CHEST  2 VIEW

[chest pa]
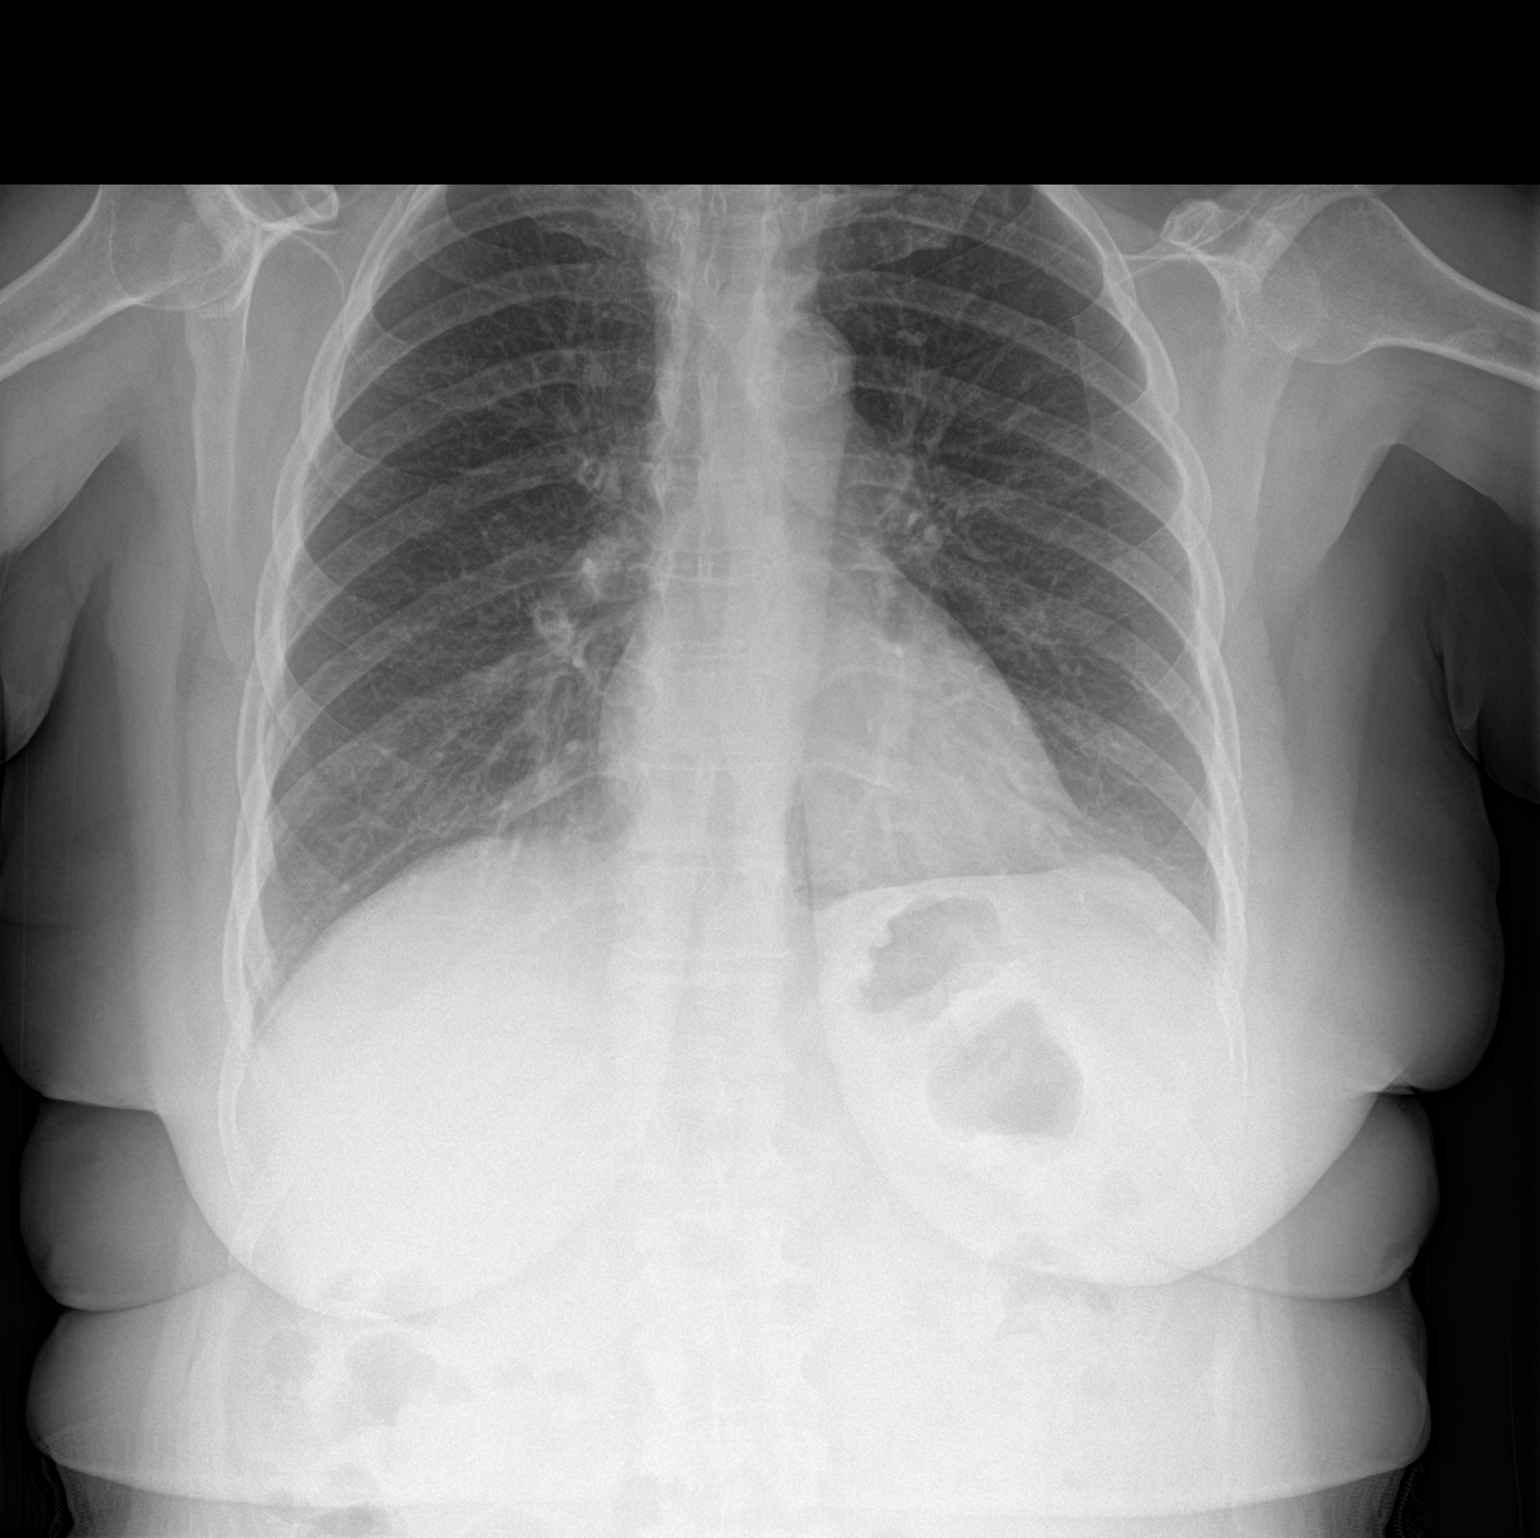

[chest lat]
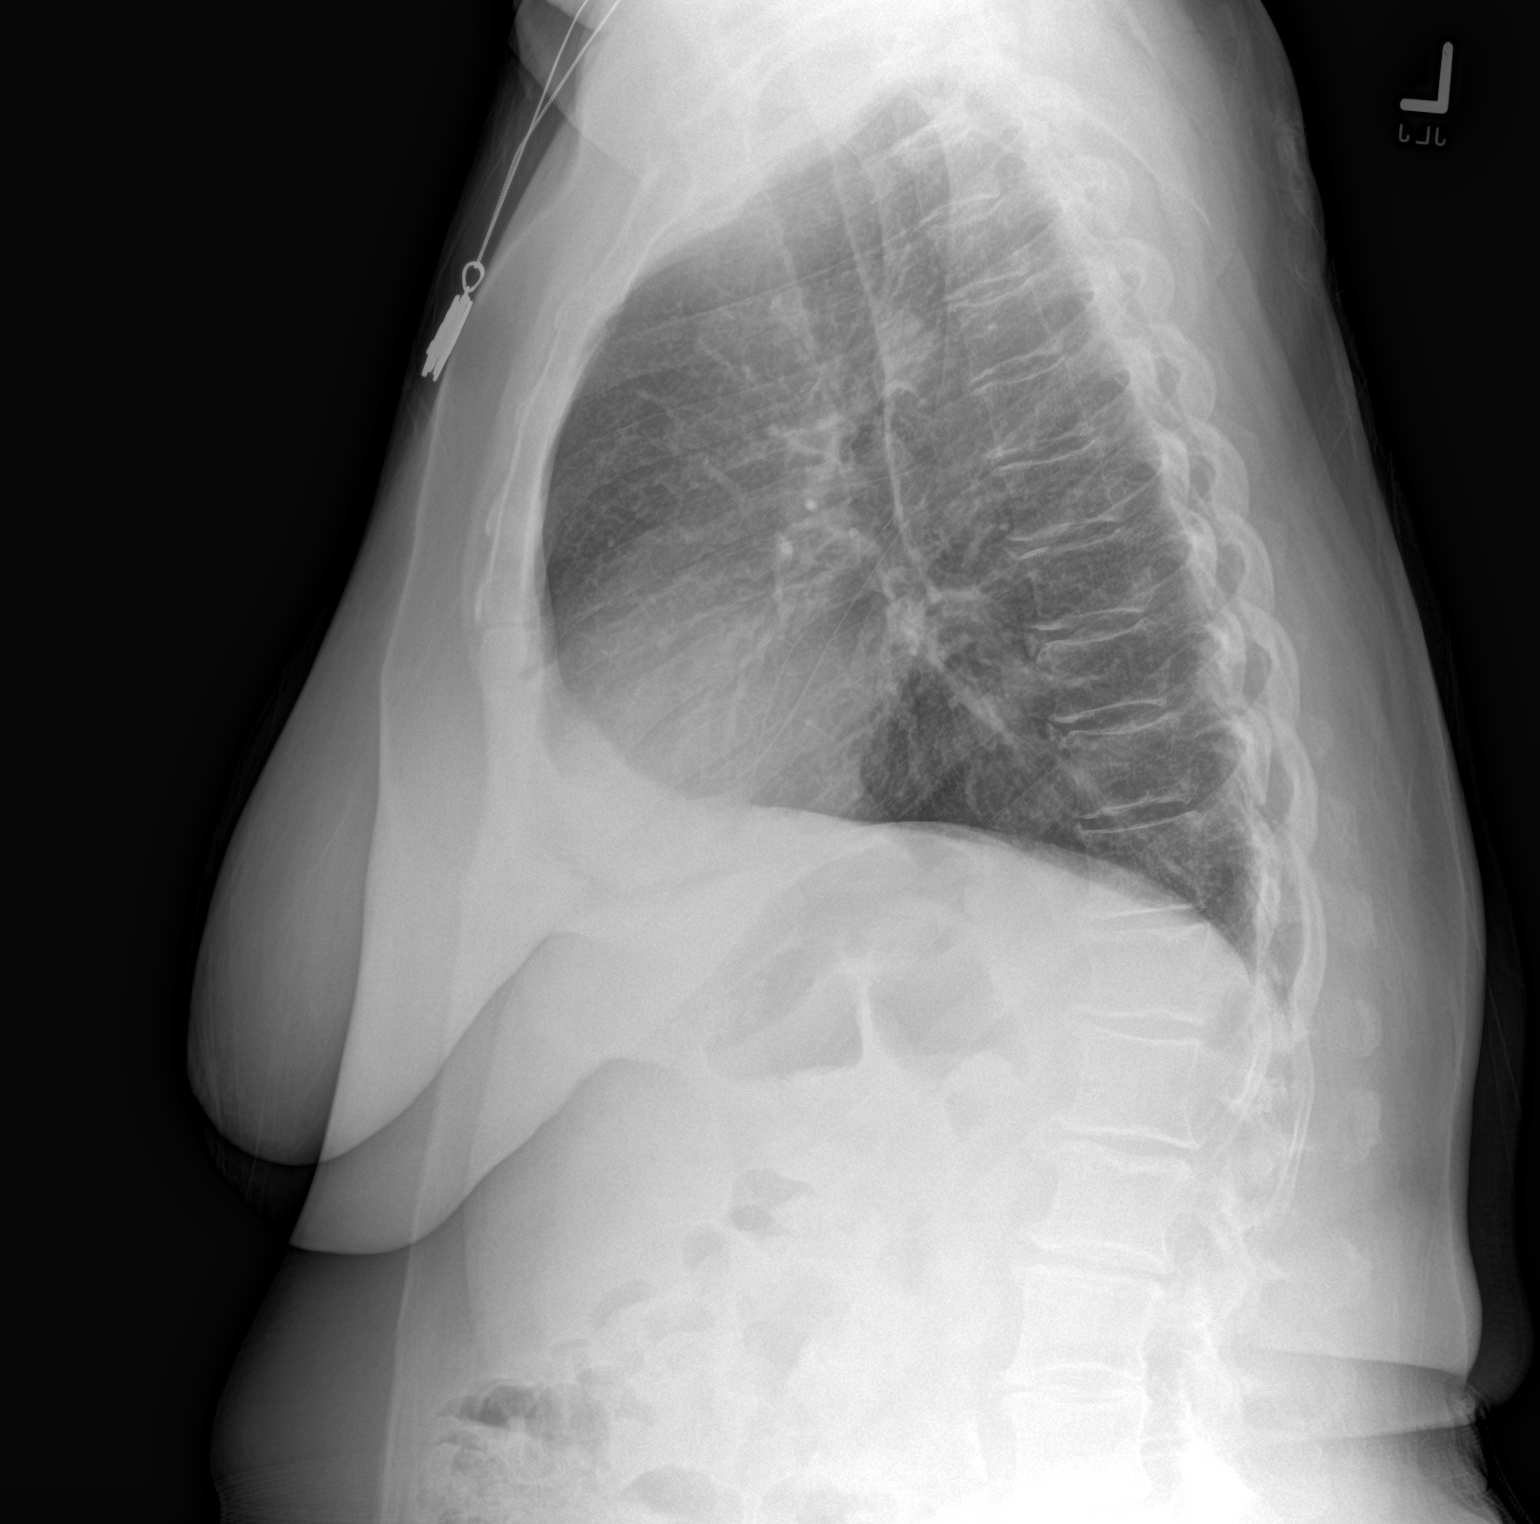

[2 of 2 positions shown; findings below may reference images not displayed]

FINDINGS: Stable cardiomediastinal silhouette with normal heart size and
aortic atherosclerosis. No pneumothorax. No pleural effusion. Lungs
appear clear, with no acute consolidative airspace disease and no
pulmonary edema.
IMPRESSION: 1. No active disease in the chest.
2. Aortic atherosclerosis.

## 2021-04-14 ENCOUNTER — Telehealth: Payer: Self-pay | Admitting: Oncology

## 2021-04-14 NOTE — Telephone Encounter (Signed)
Scheduled appt per 12/1 referral. Pt is aware of appt date and time. Pt had requested to sch appt in January.

## 2021-05-19 ENCOUNTER — Encounter: Payer: Medicare HMO | Admitting: Oncology

## 2021-05-19 ENCOUNTER — Other Ambulatory Visit: Payer: Self-pay | Admitting: Oncology

## 2021-05-19 ENCOUNTER — Other Ambulatory Visit: Payer: Medicare HMO

## 2021-05-19 DIAGNOSIS — D72829 Elevated white blood cell count, unspecified: Secondary | ICD-10-CM

## 2021-05-19 NOTE — Progress Notes (Incomplete)
[facility]  [address] [place],  [place]  [address] [phone]  Clinic Day:  05/19/2021  Referring physician: Imagene Riches, NP   HISTORY OF PRESENT ILLNESS:  The patient is a 71 y.o. female  *** who I was asked to consult upon for leukocytosis.  Recent labs at his primary care office showed an elevated white count of 13.9.   PAST MEDICAL HISTORY:   Past Medical History:  Diagnosis Date   Anxiety    Arthritis    hip and leg   Cancer ([place])    bladder   Chronic kidney disease    bladder cancer    Colovaginal fistula    2012   Diabetes mellitus    TYPE 2    Diverticulitis    Heart murmur    Hyperlipidemia    Hypertension     PAST SURGICAL HISTORY:   Past Surgical History:  Procedure Laterality Date   APPENDECTOMY     BLADDER SURGERY     COLON SURGERY  10/03/2010   ostomy reversal   COLOSTOMY     hx of    CYSTOSCOPY N/A 09/27/2017   Procedure: BLUE LIGHT CYSTOSCOPY WITH CYSVIEW;  Surgeon: Raynelle Bring, MD;  Location: [facility];  Service: Urology;  Laterality: N/A;  GENERAL ANESTHESIA WITH PARALYSIS   CYSTOSCOPY W/ RETROGRADES  05/25/2011   Procedure: CYSTOSCOPY WITH RETROGRADE PYELOGRAM;  Surgeon: Dutch Gray, MD;  Location: [facility];  Service: Urology;  Laterality: Bilateral;  C-ARM   CYSTOSCOPY W/ RETROGRADES Bilateral 07/14/2012   Procedure: CYSTOSCOPY WITH RETROGRADE PYELOGRAM;  Surgeon: Dutch Gray, MD;  Location: [facility];  Service: Urology;  Laterality: Bilateral;   CYSTOSCOPY W/ RETROGRADES Bilateral 08/27/2014   Procedure: CYSTOSCOPY WITH RETROGRADE PYELOGRAM;  Surgeon: Raynelle Bring, MD;  Location: [facility];  Service: Urology;  Laterality: Bilateral;   CYSTOSCOPY W/ RETROGRADES N/A 07/23/2016   Procedure: CYSTOSCOPY WITH RETROGRADE PYELOGRAM;  Surgeon: Raynelle Bring, MD;  Location: [facility];  Service: Urology;  Laterality: N/A;   TRANSURETHRAL RESECTION OF BLADDER TUMOR  05/25/2011   Procedure: TRANSURETHRAL  RESECTION OF BLADDER TUMOR (TURBT);  Surgeon: Dutch Gray, MD;  Location: [facility];  Service: Urology;  Laterality: N/A;   TRANSURETHRAL RESECTION OF BLADDER TUMOR N/A 07/14/2012   Procedure: TRANSURETHRAL RESECTION OF BLADDER TUMOR (TURBT);  Surgeon: Dutch Gray, MD;  Location: [facility];  Service: Urology;  Laterality: N/A;  with bladder fulguration   TRANSURETHRAL RESECTION OF BLADDER TUMOR N/A 08/27/2014   Procedure: TRANSURETHRAL RESECTION OF BLADDER TUMOR (TURBT);  Surgeon: Raynelle Bring, MD;  Location: [facility];  Service: Urology;  Laterality: N/A;   TRANSURETHRAL RESECTION OF BLADDER TUMOR N/A 07/23/2016   Procedure: TRANSURETHRAL RESECTION OF BLADDER TUMOR (TURBT);  Surgeon: Raynelle Bring, MD;  Location: [facility];  Service: Urology;  Laterality: N/A;   TRANSURETHRAL RESECTION OF BLADDER TUMOR N/A 09/27/2017   Procedure: TRANSURETHRAL RESECTION OF BLADDER TUMOR (TURBT);  Surgeon: Raynelle Bring, MD;  Location: [facility];  Service: Urology;  Laterality: N/A;  GENERAL ANESTHESIA WITH PARALYSIS   TUBAL LIGATION     vesicoureteral reimplantation      CURRENT MEDICATIONS:   Current Outpatient Medications  Medication Sig Dispense Refill   ALPRAZolam (XANAX) 0.25 MG tablet Take 0.25 mg by mouth at bedtime.  2   amitriptyline (ELAVIL) 25 MG tablet Take 25 mg by mouth at bedtime.  0   atenolol (TENORMIN) 25 MG tablet Take 25 mg by mouth 2 (two) times daily.  buPROPion (WELLBUTRIN) 100 MG tablet Take 100 mg by mouth 2 (two) times daily.  0   cyanocobalamin (,VITAMIN B-12,) 1000 MCG/ML injection Inject 1,000 mcg into the muscle every 30 (thirty) days.     escitalopram (LEXAPRO) 20 MG tablet Take 20 mg by mouth daily.  1   gabapentin (NEURONTIN) 300 MG capsule Take 300 mg by mouth 3 (three) times daily.  1   ibuprofen (ADVIL,MOTRIN) 600 MG tablet Take 600 mg by mouth daily as needed for moderate pain.   1   liraglutide (VICTOZA) 18 MG/3ML SOPN Inject 1.8 mg into the skin every evening.      lisinopril-hydrochlorothiazide (PRINZIDE,ZESTORETIC) 20-25 MG tablet Take 1 tablet by mouth daily.  2   metFORMIN (GLUCOPHAGE-XR) 750 MG 24 hr tablet Take 750 mg by mouth 2 (two) times daily.     oxyCODONE (OXY IR/ROXICODONE) 5 MG immediate release tablet Take 5 mg by mouth every 6 (six) hours as needed for pain.  0   rosuvastatin (CRESTOR) 20 MG tablet Take 20 mg by mouth at bedtime.  2   No current facility-administered medications for this visit.    ALLERGIES:   Allergies  Allergen Reactions   Tradjenta [Linagliptin] Other (See Comments)    Bad headaches   Vicodin [Hydrocodone-Acetaminophen] Nausea And Vomiting and Anxiety    " gets real upset"    FAMILY HISTORY:   Family History  Problem Relation Age of Onset   Diabetes Brother     SOCIAL HISTORY:   reports that she has been smoking cigarettes. She has a 48.00 pack-year smoking history. She has never used smokeless tobacco. She reports that she does not drink alcohol and does not use drugs.  REVIEW OF SYSTEMS:  Review of Systems - Oncology   PHYSICAL EXAM:  There were no vitals taken for this visit. Wt Readings from Last 3 Encounters:  09/27/17 170 lb (77.1 kg)  09/24/17 170 lb (77.1 kg)  07/23/16 163 lb 9 oz (74.2 kg)   There is no height or weight on file to calculate BMI. Performance status (ECOG): {CHL ONC [id number] Physical Exam  LABS:   CBC Latest Ref Rng & Units 09/24/2017 07/13/2016 06/20/2012  WBC 4.0 - 10.5 K/uL 11.0(H) 11.4(H) 11.6(H)  Hemoglobin 12.0 - 15.0 g/dL 12.7 12.2 12.9  Hematocrit 36.0 - 46.0 % 38.7 37.1 39.6  Platelets 150 - 400 K/uL 244 270 345   CMP Latest Ref Rng & Units 09/24/2017 07/13/2016 08/22/2014  Glucose 65 - 99 mg/dL 206(H) 107(H) 171(H)  BUN 6 - 20 mg/dL 41(H) 34(H) 25(H)  Creatinine 0.44 - 1.00 mg/dL 1.73(H) 1.36(H) 0.98  Sodium 135 - 145 mmol/L 139 138 138  Potassium 3.5 - 5.1 mmol/L 4.4 4.5 4.4  Chloride 101 - 111 mmol/L 102 105 105  CO2 22 - 32 mmol/L 23 25 26    Calcium 8.9 - 10.3 mg/dL 9.8 9.6 9.1  Total Protein 6.0 - 8.3 g/dL - - -  Total Bilirubin 0.3 - 1.2 mg/dL - - -  Alkaline Phos 39 - 117 U/L - - -  AST 0 - 37 U/L - - -  ALT 0 - 35 U/L - - -     No results found for: CEA1 / No results found for: CEA1 No results found for: PSA1 No results found for: HRC163 No results found for: AGT364  No results found for: TOTALPROTELP, ALBUMINELP, A1GS, A2GS, BETS, BETA2SER, GAMS, MSPIKE, SPEI No results found for: TIBC, FERRITIN, IRONPCTSAT No results found for: LDH  No  results found for: AFPTUMOR, TOTALPROTELP, ALBUMINELP, A1GS, A2GS, BETS, BETA2SER, GAMS, MSPIKE, SPEI, LDH, CEA1, PSA1, IGASERUM, IGGSERUM, IGMSERUM, THGAB, THYROGLB  Recent Review Flowsheet Data   There is no flowsheet data to display.     STUDIES:  No results found.   ASSESSMENT & PLAN:  A 71 y.o. female who I was asked to consult upon for *** .The patient understands all the plans discussed today and is in agreement with them.  I do appreciate Imagene Riches, NP for his new consult.   Raymie Trani Macarthur Critchley, MD

## 2021-08-04 ENCOUNTER — Other Ambulatory Visit: Payer: Self-pay | Admitting: Urology

## 2021-08-22 NOTE — Progress Notes (Addendum)
Anesthesia Review: ? ?PCP: DR Jonelle Sports in [place] [phone]- have requested most recetn hgba1c result and lov note.  They are to fax.  On chart along with last hgba1c done at off on 08/13/21 of 13.0.   ?Cardiologist : none  ?Chest x-ray : ?EKG : 08/26/21  ?Echo : ?Stress test: ?Cardiac Cath :  ?Activity level:  cannot do a flight of stairs without difficulty  ?Sleep Study/ CPAP : none  ?Fasting Blood Sugar :      / Checks Blood Sugar -- times a day:   ?Blood Thinner/ Instructions /Last Dose: ?ASA / Instructions/ Last Dose :   ?DM- type 2 ?Hgba1c- 08/26/21 - 12.8 routed to Dr Alinda Money.   ?Shawn Stall aware.   ?Checks glucose twice daily  at home  ?AT preop appt on 08/26/21- glucose was 330.  PT stated she took pm meds and am meds.  PT states she has not eaten this am.  Made Shawn Stall aware.  PT given glucose reading from today and instructed to call her PCP.  PT voiced understanding.  PT states' My glucose normally runs high in the am and lower in the afternoon. " INformed pt that surgery could be cancelled if glucose if high am of surgery.  PT voiced understanding.   ?325 MG aSPIRIN- lAST DOSE ON 08/23/2021 PER PT  ?Bmp done 08/26/21 routed to dr Alinda Money.   ?

## 2021-08-25 NOTE — Progress Notes (Addendum)
DUE TO COVID-19 ONLY  2 VISITOR IS ALLOWED TO COME WITH YOU AND STAY IN THE WAITING ROOM ONLY DURING PRE OP AND PROCEDURE DAY OF SURGERY.   4 VISITOR  MAY VISIT WITH YOU AFTER SURGERY IN YOUR PRIVATE ROOM DURING VISITING HOURS ONLY! ?YOU MAY HAVE ONE PERSON SPEND THE NITE WITH YOU IN YOUR ROOM AFTER SURGERY.   ? ? Your procedure is scheduled on:  ? 09/04/2021  ? Report to [facility] Main  Entrance ? ? Report to admitting at     0645             AM ?DO NOT BRING INSURANCE CARD, PICTURE ID OR WALLET DAY OF SURGERY.  ?  ? ? Call this number if you have problems the morning of surgery [phone]  ? ? REMEMBER: NO  SOLID FOODS , CANDY, GUM OR MINTS AFTER MIDNITE THE NITE BEFORE SURGERY .       Marland Kitchen CLEAR LIQUIDS UNTIL      0600am            DAY OF SURGERY.      ? ? ?CLEAR LIQUID DIET ? ? ?Foods Allowed      ?WATER ?BLACK COFFEE ( SUGAR OK, NO MILK, CREAM OR CREAMER) REGULAR AND DECAF  ?TEA ( SUGAR OK NO MILK, CREAM, OR CREAMER) REGULAR AND DECAF  ?PLAIN JELLO ( NO RED)  ?FRUIT ICES ( NO RED, NO FRUIT PULP)  ?POPSICLES ( NO RED)  ?JUICE- APPLE, WHITE GRAPE AND WHITE CRANBERRY  ?SPORT DRINK LIKE GATORADE ( NO RED)  ?CLEAR BROTH ( VEGETABLE , CHICKEN OR BEEF)                                                               ? ?    ? ?BRUSH YOUR TEETH MORNING OF SURGERY AND RINSE YOUR MOUTH OUT, NO CHEWING GUM CANDY OR MINTS. ?  ? ? Take these medicines the morning of surgery with A SIP OF WATER:  ateolol, wellbutrin, lexapro, gabapentin  ? ? ?DO NOT TAKE ANY DIABETIC MEDICATIONS DAY OF YOUR SURGERY ?                  ?            You may not have any metal on your body including hair pins and  ?            piercings  Do not wear jewelry, make-up, lotions, powders or perfumes, deodorant ?            Do not wear nail polish on your fingernails.   ?           IF YOU ARE A FEMALE AND WANT TO SHAVE UNDER ARMS OR LEGS PRIOR TO SURGERY YOU MUST DO SO AT LEAST 48 HOURS PRIOR TO SURGERY.  ?            Men may shave face and  neck. ? ? Do not bring valuables to the hospital. [facility] NOT ?            RESPONSIBLE   FOR VALUABLES. ? Contacts, dentures or bridgework may not be worn into surgery. ? Leave suitcase in the car. After surgery it may be brought to your  room. ? ?  ? Patients discharged the day of surgery will not be allowed to drive home. IF YOU ARE HAVING SURGERY AND GOING HOME THE SAME DAY, YOU MUST HAVE AN ADULT TO DRIVE YOU HOME AND BE WITH YOU FOR 24 HOURS. YOU MAY GO HOME BY TAXI OR UBER OR ORTHERWISE, BUT AN ADULT MUST ACCOMPANY YOU HOME AND STAY WITH YOU FOR 24 HOURS. ?  ? ?            Please read over the following fact sheets you were given: ?_____________________________________________________________________ ? ?[facility] - Preparing for Surgery ?Before surgery, you can play an important role.  Because skin is not sterile, your skin needs to be as free of germs as possible.  You can reduce the number of germs on your skin by washing with CHG (chlorahexidine gluconate) soap before surgery.  CHG is an antiseptic cleaner which kills germs and bonds with the skin to continue killing germs even after washing. ?Please DO NOT use if you have an allergy to CHG or antibacterial soaps.  If your skin becomes reddened/irritated stop using the CHG and inform your nurse when you arrive at Short Stay. ?Do not shave (including legs and underarms) for at least 48 hours prior to the first CHG shower.  You may shave your face/neck. ?Please follow these instructions carefully: ? 1.  Shower with CHG Soap the night before surgery and the  morning of Surgery. ? 2.  If you choose to wash your hair, wash your hair first as usual with your  normal  shampoo. ? 3.  After you shampoo, rinse your hair and body thoroughly to remove the  shampoo.                           4.  Use CHG as you would any other liquid soap.  You can apply chg directly  to the skin and wash  ?                     Gently with a scrungie or clean washcloth. ? 5.   Apply the CHG Soap to your body ONLY FROM THE NECK DOWN.   Do not use on face/ open      ?                     Wound or open sores. Avoid contact with eyes, ears mouth and genitals (private parts).  ?                     Production manager,  Genitals (private parts) with your normal soap. ?            6.  Wash thoroughly, paying special attention to the area where your surgery  will be performed. ? 7.  Thoroughly rinse your body with warm water from the neck down. ? 8.  DO NOT shower/wash with your normal soap after using and rinsing off  the CHG Soap. ?               9.  Pat yourself dry with a clean towel. ?           10.  Wear clean pajamas. ?           11.  Place clean sheets on your bed the night of your first shower and do not  sleep with pets. ?Day of Surgery : ?Do  not apply any lotions/deodorants the morning of surgery.  Please wear clean clothes to the hospital/surgery center. ? ?FAILURE TO FOLLOW THESE INSTRUCTIONS MAY RESULT IN THE CANCELLATION OF YOUR SURGERY ?PATIENT SIGNATURE_________________________________ ? ?NURSE SIGNATURE__________________________________ ? ?________________________________________________________________________  ? ? ?           ?

## 2021-08-26 ENCOUNTER — Encounter (HOSPITAL_COMMUNITY)
Admission: RE | Admit: 2021-08-26 | Discharge: 2021-08-26 | Disposition: A | Discharge status: Home / Self Care | Payer: Medicare HMO | Source: Ambulatory Visit | Attending: Urology | Admitting: Urology

## 2021-08-26 ENCOUNTER — Other Ambulatory Visit: Payer: Self-pay

## 2021-08-26 ENCOUNTER — Encounter (HOSPITAL_COMMUNITY): Payer: Self-pay

## 2021-08-26 VITALS — BP 111/77 | HR 79 | Temp 98.4°F | Resp 16 | Ht 60.0 in | Wt 145.0 lb

## 2021-08-26 DIAGNOSIS — Z01818 Encounter for other preprocedural examination: Secondary | ICD-10-CM

## 2021-08-26 DIAGNOSIS — Z7984 Long term (current) use of oral hypoglycemic drugs: Secondary | ICD-10-CM | POA: Diagnosis not present

## 2021-08-26 DIAGNOSIS — C679 Malignant neoplasm of bladder, unspecified: Secondary | ICD-10-CM | POA: Diagnosis not present

## 2021-08-26 DIAGNOSIS — E1122 Type 2 diabetes mellitus with diabetic chronic kidney disease: Secondary | ICD-10-CM | POA: Insufficient documentation

## 2021-08-26 DIAGNOSIS — Z720 Tobacco use: Secondary | ICD-10-CM | POA: Diagnosis not present

## 2021-08-26 DIAGNOSIS — I129 Hypertensive chronic kidney disease with stage 1 through stage 4 chronic kidney disease, or unspecified chronic kidney disease: Secondary | ICD-10-CM | POA: Insufficient documentation

## 2021-08-26 DIAGNOSIS — N189 Chronic kidney disease, unspecified: Secondary | ICD-10-CM | POA: Diagnosis not present

## 2021-08-26 LAB — CBC
HCT: 40.7 % (ref 36.0–46.0)
Hemoglobin: 13.3 g/dL (ref 12.0–15.0)
MCH: 28.2 pg (ref 26.0–34.0)
MCHC: 32.7 g/dL (ref 30.0–36.0)
MCV: 86.2 fL (ref 80.0–100.0)
Platelets: 219 10*3/uL (ref 150–400)
RBC: 4.72 MIL/uL (ref 3.87–5.11)
RDW: 13.2 % (ref 11.5–15.5)
WBC: 11.6 10*3/uL — ABNORMAL HIGH (ref 4.0–10.5)
nRBC: 0 % (ref 0.0–0.2)

## 2021-08-26 LAB — BASIC METABOLIC PANEL
Anion gap: 10 (ref 5–15)
BUN: 12 mg/dL (ref 8–23)
CO2: 23 mmol/L (ref 22–32)
Calcium: 8.8 mg/dL — ABNORMAL LOW (ref 8.9–10.3)
Chloride: 104 mmol/L (ref 98–111)
Creatinine, Ser: 1.05 mg/dL — ABNORMAL HIGH (ref 0.44–1.00)
GFR, Estimated: 57 mL/min — ABNORMAL LOW (ref >60)
Glucose, Bld: 331 mg/dL — ABNORMAL HIGH (ref 70–99)
Potassium: 3.9 mmol/L (ref 3.5–5.1)
Sodium: 137 mmol/L (ref 135–145)

## 2021-08-26 LAB — GLUCOSE, CAPILLARY: Glucose-Capillary: 330 mg/dL — ABNORMAL HIGH (ref 70–99)

## 2021-08-26 LAB — HEMOGLOBIN A1C
Hgb A1c MFr Bld: 12.8 % — ABNORMAL HIGH (ref 4.8–5.6)
Mean Plasma Glucose: 320.66 mg/dL

## 2021-08-27 NOTE — Progress Notes (Addendum)
Anesthesia Chart Review ? ? Case: [id number] Date/Time: 09/04/21 0845  ? Procedure: TRANSURETHRAL RESECTION OF BLADDER TUMOR (TURBT) WITH CYSTOSCOPY  ? Anesthesia type: General  ? Pre-op diagnosis: BLADDER CANCER  ? Location: [facility]  ? Surgeons: Raynelle Bring, MD  ? ?  ? ? ?DISCUSSION:71 y.o. smoker with h/o HTN, CKD, DM II, bladder cancer scheduled for above procedure 09/04/2021 with Dr. Raynelle Bring.  ? ?Poorly controlled diabetes.  A1C 12.8.  CBG 330, pt reports she has had readings in the 300s in the mornings. Last seen by PCP 08/13/2021.  At this visit diabetes medication regimen reviewed.  She had not been taking Januvia as prescribed at this time. Pt advised by PAT nurse to contact PCP.  Needs better glucose control before surgery.  Discussed with Dr. Lynne Logan scheduler. Discussed risk of cancellation DOS if Dr. Alinda Money wishes to proceed.  ?VS: BP 111/77   Pulse 79   Temp 36.9 ?C (Oral)   Resp 16   Ht 5' (1.524 m)   Wt 65.8 kg   SpO2 95%   BMI 28.32 kg/m?  ? ?PROVIDERS: ?Barnetta Chapel, NP is PCP  ? ? ?LABS:  forwarded to surgeon and PCP  ?(all labs ordered are listed, but only abnormal results are displayed) ? ?Labs Reviewed  ?CBC - Abnormal; Notable for the following components:  ?    Result Value  ? WBC 11.6 (*)   ? All other components within normal limits  ?BASIC METABOLIC PANEL - Abnormal; Notable for the following components:  ? Glucose, Bld 331 (*)   ? Creatinine, Ser 1.05 (*)   ? Calcium 8.8 (*)   ? GFR, Estimated 57 (*)   ? All other components within normal limits  ?GLUCOSE, CAPILLARY - Abnormal; Notable for the following components:  ? Glucose-Capillary 330 (*)   ? All other components within normal limits  ?HEMOGLOBIN A1C - Abnormal; Notable for the following components:  ? Hgb A1c MFr Bld 12.8 (*)   ? All other components within normal limits  ? ? ? ?IMAGES: ? ? ?EKG: ?08/26/2021 ?Rate 78 bpm  ?Normal sinus rhythm ?Low voltage QRS ?Left anterior fascicular block ?Poor  anterior R wave progression ?When compared with ECG of 24-Sep-2017 12:09, ?No significant change was found ? ?CV: ? ?Past Medical History:  ?Diagnosis Date  ? Cancer [facility])   ? bladder  ? Chronic kidney disease   ? bladder cancer   ? 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 Diabetes mellitus   ? TYPE 2   ? Diverticulitis   ? Heart murmur   ? Hyperlipidemia   ? Hypertension   ? ? ?Past Surgical History:  ?Procedure Laterality Date  ? APPENDECTOMY    ? BLADDER SURGERY    ? COLON SURGERY  10/03/2010  ? ostomy reversal  ? COLOSTOMY    ? hx of   ? CYSTOSCOPY N/A 09/27/2017  ? Procedure: BLUE LIGHT CYSTOSCOPY WITH CYSVIEW;  Surgeon: Raynelle Bring, MD;  Location: [facility];  Service: Urology;  Laterality: N/A;  GENERAL ANESTHESIA WITH PARALYSIS  ? CYSTOSCOPY W/ RETROGRADES  05/25/2011  ? Procedure: CYSTOSCOPY WITH RETROGRADE PYELOGRAM;  Surgeon: Dutch Gray, MD;  Location: [facility];  Service: Urology;  Laterality: Bilateral;  C-ARM  ? CYSTOSCOPY W/ RETROGRADES Bilateral 07/14/2012  ? Procedure: CYSTOSCOPY WITH RETROGRADE PYELOGRAM;  Surgeon: Dutch Gray, MD;  Location[facility];  Service: Urology;  Laterality: Bilateral;  ? CYSTOSCOPY W/ RETROGRADES Bilateral 08/27/2014  ? Procedure:  CYSTOSCOPY WITH RETROGRADE PYELOGRAM;  Surgeon: Raynelle Bring, MD;  [facility];  Service: Urology;  Laterality: Bilateral;  ? CYSTOSCOPY W/ RETROGRADES N/A 07/23/2016  ? Procedure: CYSTOSCOPY WITH RETROGRADE PYELOGRAM;  Surgeon: Raynelle Bring, MD;  [facility];  Service: Urology;  Laterality: N/A;  ? TRANSURETHRAL RESECTION OF BLADDER TUMOR  05/25/2011  ? Procedure: TRANSURETHRAL RESECTION OF BLADDER TUMOR (TURBT);  Surgeon: Dutch Gray, MD;  [facility];  Service: Urology;  Laterality: N/A;  ? TRANSURETHRAL RESECTION OF BLADDER TUMOR N/A 07/14/2012  ? Procedure: TRANSURETHRAL RESECTION OF BLADDER TUMOR (TURBT);  Surgeon: Dutch Gray, MD;  Location[facility];  Service: Urology;  Laterality: N/A;  with bladder fulguration  ? TRANSURETHRAL RESECTION OF BLADDER  TUMOR N/A 08/27/2014  ? Procedure: TRANSURETHRAL RESECTION OF BLADDER TUMOR (TURBT);  Surgeon: Raynelle Bring, MD;  [facility];  Service: Urology;  Laterality: N/A;  ? TRANSURETHRAL RESECTION OF BLADDER TUMOR N/A 07/23/2016  ? Procedure: TRANSURETHRAL RESECTION OF BLADDER TUMOR (TURBT);  Surgeon: Raynelle Bring, MD;  [facility];  Service: Urology;  Laterality: N/A;  ? TRANSURETHRAL RESECTION OF BLADDER TUMOR N/A 09/27/2017  ? Procedure: TRANSURETHRAL RESECTION OF BLADDER TUMOR (TURBT);  Surgeon: Raynelle Bring, MD;  Location[facility];  Service: Urology;  Laterality: N/A;  GENERAL ANESTHESIA WITH PARALYSIS  ? TUBAL LIGATION    ? vesicoureteral reimplantation    ? ? ?MEDICATIONS: ? amitriptyline (ELAVIL) 50 MG tablet  ? amLODipine (NORVASC) 2.5 MG tablet  ? aspirin EC 325 MG tablet  ? atenolol (TENORMIN) 25 MG tablet  ? calcium-vitamin D (OSCAL WITH D) 500-5 MG-MCG tablet  ? FARXIGA 10 MG TABS tablet  ? gabapentin (NEURONTIN) 300 MG capsule  ? JANUVIA 100 MG tablet  ? Multiple Vitamin (MULTIVITAMIN WITH MINERALS) TABS tablet  ? Omega-3 Fatty Acids (FISH OIL) 1200 MG CAPS  ? oxyCODONE (OXY IR/ROXICODONE) 5 MG immediate release tablet  ? rosuvastatin (CRESTOR) 20 MG tablet  ? Semaglutide (RYBELSUS) 3 MG TABS  ? TRESIBA FLEXTOUCH 200 UNIT/ML FlexTouch Pen  ? ?No current facility-administered medications for this encounter.  ? ? ?Konrad Felix Ward, PA-C ?[facility] ?[phone] ? ? ? ? ? ? ?

## 2021-08-27 NOTE — Anesthesia Preprocedure Evaluation (Addendum)
Anesthesia Evaluation  ?Patient identified by MRN, date of birth, ID band ?Patient awake ? ? ? ?Reviewed: ?Allergy & Precautions, NPO status , Patient's Chart, lab work & pertinent test results ? ?Airway ?Mallampati: II ? ?TM Distance: >3 FB ?Neck ROM: Full ? ? ? Dental ?no notable dental hx. ? ?  ?Pulmonary ?neg pulmonary ROS, Current Smoker,  ?  ?Pulmonary exam normal ?breath sounds clear to auscultation ? ? ? ? ? ? Cardiovascular ?hypertension, Pt. on medications ?Normal cardiovascular exam ?Rhythm:Regular Rate:Normal ? ? ?  ?Neuro/Psych ?negative neurological ROS ? negative psych ROS  ? GI/Hepatic ?negative GI ROS, Neg liver ROS,   ?Endo/Other  ?diabetes, Poorly Controlled ? Renal/GU ?negative Renal ROS  ?negative genitourinary ?  ?Musculoskeletal ?negative musculoskeletal ROS ?(+)  ? Abdominal ?  ?Peds ?negative pediatric ROS ?(+)  Hematology ?negative hematology ROS ?(+)   ?Anesthesia Other Findings ? ? Reproductive/Obstetrics ?negative OB ROS ? ?  ? ? ? ? ? ? ? ? ? ? ? ? ? ?  ?  ? ? ? ? ? ? ? ?Anesthesia Physical ?Anesthesia Plan ? ?ASA: 3 ? ?Anesthesia Plan: General  ? ?Post-op Pain Management: Minimal or no pain anticipated  ? ?Induction: Intravenous ? ?PONV Risk Score and Plan: 2 and Ondansetron and Treatment may vary due to age or medical condition ? ?Airway Management Planned: Oral ETT ? ?Additional Equipment:  ? ?Intra-op Plan:  ? ?Post-operative Plan: Extubation in OR ? ?Informed Consent: I have reviewed the patients History and Physical, chart, labs and discussed the procedure including the risks, benefits and alternatives for the proposed anesthesia with the patient or authorized representative who has indicated his/her understanding and acceptance.  ? ? ? ?Dental advisory given ? ?Plan Discussed with: CRNA and Surgeon ? ?Anesthesia Plan Comments: (See PAT note 08/26/2021)  ? ? ? ? ? ?Anesthesia Quick Evaluation ? ?

## 2021-09-03 ENCOUNTER — Encounter (HOSPITAL_COMMUNITY): Payer: Self-pay | Admitting: Urology

## 2021-09-03 NOTE — H&P (Addendum)
?  ?CC/HPI: Bladder cancer  ? ?Jill Fox returns today for ongoing cystoscopic surveillance of her low-grade superficial bladder cancer. She is now 4 years out from her most recent recurrence in 2019 after undergoing resection with bluelight cystoscopy. She denies any gross hematuria. She has apparently dealt with some issues with chronic kidney disease although this has apparently now stabilized.  ? ?  ?ALLERGIES: No Allergies ?  ? ?MEDICATIONS: Percocet  ?Alprazolam 0.5 mg tablet Oral  ?Amitriptyline Hcl  ?Aspirin 325 MG Oral Tablet Oral  ?Atenolol 25 mg tablet Oral  ?Clonazepam 1 mg tablet Oral  ?Escitalopram Oxalate 5 mg tablet Oral  ?Gabapentin 300 mg capsule Oral  ?Glipizide 10 mg tablet Oral  ?Ibuprofen 600 mg tablet Oral  ?Lexapro 10 mg tablet Oral  ?Lisinopril-Hydrochlorothiazide 20-25 MG Oral Tablet Oral  ?Nystatin 100,000 unit/gram cream External  ?Pravastatin Sodium 40 mg tablet Oral  ?Rosuvastatin Calcium  ?Rybelsus 7 mg tablet  ?Victoza 3-Pak 0.6 mg/0.1 ml (18 mg/3 ml) pen injector Subcutaneous  ?  ? ?GU PSH: Cysto Bladder Ureth Biopsy - 2011 ?Cystoscopy - 07/17/2020, 12/22/2019, 2021, 2020, 2020, 2019, 2019, 2019, 2018, 2018, 2017 ?Cystoscopy Insert Stent - 2012 ?Cystoscopy TURBT <2 cm - 2018, 2014, 2013 ?Cystoscopy TURBT 2-5 cm - 2019, 2016 ? ?  ?   ?PSH Notes: Cystoscopy With Fulguration Medium Lesion (2-5cm), Cystoscopy With Fulguration Small Lesion (5-3m), Cystoscopy With Fulguration Small Lesion (5-252m, Cystoscopy With Insertion Of Ureteral Stent Left, Closure Of Enterovesical Fistula, Cystoscopy With Biopsy, Vesicoureteral Reimplantation, Partial Colectomy, Tubal Ligation  ? ?NON-GU PSH: Partial colectomy - 2011 ?Repair Bowel-bladder Fistula - 2012 ?Tubal Ligation - 2011 ? ?  ? ?GU PMH: History of bladder cancer - 07/17/2020, Bladder Cancer, - 2014 ?Acute Cystitis/UTI - 2019 ?Bladder Cancer overlapping sites, Malignant neoplasm of overlapping sites of bladder - 2017 ?Urinary Tract Inf, Unspec  site, Pyuria - 2015, Urinary tract infection, - 2014 ?Mixed incontinence, Urge and stress incontinence - 2014 ?Nocturia, Nocturia - 2014 ?Urinary Frequency, Increased urinary frequency - 2014 ?Vesicointestinal fistula, Colovesical fistula - 2014 ?  ?   ?PMH Notes:  ? 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 She is s/p repair by Dr. ToMarlou Starksnd myself on 05/20/10.  ? 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  ? ?[facility]  ?Jul 2011: TURBT by Dr. PuBurnell Blankslow grade Ta)  ?Jan 2013: TURBT (low grade Ta, multiple tumors)  ?Mar 2014: TURBT (low grade Ta, multiple tumors)  ?Apr 2016: TURBT (low grade Ta, multiple tumors)  ?Mar 2018: TURBT (low grade Ta, multiple tumors)  ?May 2019: BL TURBT (low grade Ta, multiple tumors)  ? ?Last upper tract imaging: Mar 2018 ([facility])  ?  ? ?NON-GU PMH: Bacteriuria (Stable) - 2021, (Stable), - 2019 (Stable), - 2019, - 2018 ?Diabetes Type 2 ?Diverticulitis ?Hypertension ?  ? ?FAMILY HISTORY: Bladder Cancer - No Family History ?Heart Disease - Father ?Kidney Cancer - No Family History  ? ?SOCIAL HISTORY: Marital Status: Single ?Preferred Language: [place]nicity: Not Hispanic Or Latino; Race: White ?Current Smoking Status: Patient smokes.  ?Does drink.  ?Drinks 1 caffeinated drink per day. ?  ?  Notes: Current every day smoker, Marital History - Single, Tobacco Use, Alcohol Use, Occupation:  ? ?REVIEW OF SYSTEMS:    ?GU Review Female:   Patient denies  frequent urination, hard to postpone urination, burning /pain with urination, get up at night to urinate, leakage of urine, stream starts and stops, trouble starting your stream, have to strain to urinate, and currently pregnant.  ?Gastrointestinal (Lower):   Patient denies diarrhea and constipation.  ?Gastrointestinal (Upper):    Patient denies nausea and vomiting.  ?Constitutional:   Patient denies fever, night sweats, weight loss, and fatigue.  ?Skin:   Patient denies skin rash/ lesion and itching.  ?Eyes:   Patient denies blurred vision and double vision.  ?Ears/ Nose/ Throat:   Patient denies sore throat and sinus problems.  ?Hematologic/Lymphatic:   Patient denies swollen glands and easy bruising.  ?Cardiovascular:   Patient denies leg swelling and chest pains.  ?Respiratory:   Patient denies cough and shortness of breath.  ?Endocrine:   Patient denies excessive thirst.  ?Musculoskeletal:   Patient denies back pain and joint pain.  ?Neurological:   Patient denies headaches and dizziness.  ?Psychologic:   Patient denies depression and anxiety.  ? ?VITAL SIGNS:    ? ?Weight 160 lb / 72.57 kg  ?BMI 31.2 kg/m?  ? ?GU PHYSICAL EXAMINATION:    ?Urethral Meatus: Normal size. Normal position. No discharge.  ? ?MULTI-SYSTEM PHYSICAL EXAMINATION:    ?Constitutional: Well-nourished. No physical deformities. Normally developed. Good grooming.  ?Respiratory: No labored breathing, no use of accessory muscles. Clear bilaterally.  ?Cardiovascular: Normal temperature, normal extremity pulses, no swelling, no varicosities. Regular rate and rhythm.  ? ?  ?Complexity of Data:  ?Records Review:   Previous Patient Records  ? ? ? ? ? ?Notes: qns to spin ?  ? ?ASSESSMENT:  ?    ICD-10 Details  ?1 GU:   Bladder Cancer overlapping sites - C67.8   ? ?PLAN:    ? ?      Schedule ?Return Visit/Planned Activity: Other See Visit Notes  ?           Note: Will call to schedule surgery.  ? ? ?      Document ?Letter(s):  Created for Patient: Clinical Summary  ? ? ?     Notes:   1. Bladder cancer: We discussed her findings at cystoscopy. She does appear to have at least a single small tumor. This does measure less than 1 cm. She also has multiple other areas that do raise some concern. A bladder washing has been obtained for cytology. I recommended that she proceed with  cystoscopy and biopsy/transurethral resection, and fulguration versus removal of her other lesions if they look more suspicious at the time of her procedure.  ? ?We have reviewed the potential risks, complications, and expected recovery process associated with this procedure. She gives informed consent. Her renal function will be checked preoperatively.  ? ?    ?

## 2021-09-03 NOTE — H&P (Incomplete Revision)
?  ?CC/HPI: Bladder cancer  ? ?Jill Fox returns today for ongoing cystoscopic surveillance of her low-grade superficial bladder cancer. She is now 4 years out from her most recent recurrence in 2019 after undergoing resection with bluelight cystoscopy. She denies any gross hematuria. She has apparently dealt with some issues with chronic kidney disease although this has apparently now stabilized.  ? ?  ?ALLERGIES: No Allergies ?  ? ?MEDICATIONS: Percocet  ?Alprazolam 0.5 mg tablet Oral  ?Amitriptyline Hcl  ?Aspirin 325 MG Oral Tablet Oral  ?Atenolol 25 mg tablet Oral  ?Clonazepam 1 mg tablet Oral  ?Escitalopram Oxalate 5 mg tablet Oral  ?Gabapentin 300 mg capsule Oral  ?Glipizide 10 mg tablet Oral  ?Ibuprofen 600 mg tablet Oral  ?Lexapro 10 mg tablet Oral  ?Lisinopril-Hydrochlorothiazide 20-25 MG Oral Tablet Oral  ?Nystatin 100,000 unit/gram cream External  ?Pravastatin Sodium 40 mg tablet Oral  ?Rosuvastatin Calcium  ?Rybelsus 7 mg tablet  ?Victoza 3-Pak 0.6 mg/0.1 ml (18 mg/3 ml) pen injector Subcutaneous  ?  ? ?GU PSH: Cysto Bladder Ureth Biopsy - 2011 ?Cystoscopy - 07/17/2020, 12/22/2019, 2021, 2020, 2020, 2019, 2019, 2019, 2018, 2018, 2017 ?Cystoscopy Insert Stent - 2012 ?Cystoscopy TURBT <2 cm - 2018, 2014, 2013 ?Cystoscopy TURBT 2-5 cm - 2019, 2016 ? ?  ?   ?PSH Notes: Cystoscopy With Fulguration Medium Lesion (2-5cm), Cystoscopy With Fulguration Small Lesion (5-6m), Cystoscopy With Fulguration Small Lesion (5-286m, Cystoscopy With Insertion Of Ureteral Stent Left, Closure Of Enterovesical Fistula, Cystoscopy With Biopsy, Vesicoureteral Reimplantation, Partial Colectomy, Tubal Ligation  ? ?NON-GU PSH: Partial colectomy - 2011 ?Repair Bowel-bladder Fistula - 2012 ?Tubal Ligation - 2011 ? ?  ? ?GU PMH: History of bladder cancer - 07/17/2020, Bladder Cancer, - 2014 ?Acute Cystitis/UTI - 2019 ?Bladder Cancer overlapping sites, Malignant neoplasm of overlapping sites of bladder - 2017 ?Urinary Tract Inf, Unspec  site, Pyuria - 2015, Urinary tract infection, - 2014 ?Mixed incontinence, Urge and stress incontinence - 2014 ?Nocturia, Nocturia - 2014 ?Urinary Frequency, Increased urinary frequency - 2014 ?Vesicointestinal fistula, Colovesical fistula - 2014 ?  ?   ?PMH Notes:  ? 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 She is s/p repair by Dr. ToMarlou Starksnd myself on 05/20/10.  ? 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  ? ?[facility]  ?Jul 2011: TURBT by Dr. PuBurnell Blankslow grade Ta)  ?Jan 2013: TURBT (low grade Ta, multiple tumors)  ?Mar 2014: TURBT (low grade Ta, multiple tumors)  ?Apr 2016: TURBT (low grade Ta, multiple tumors)  ?Mar 2018: TURBT (low grade Ta, multiple tumors)  ?May 2019: BL TURBT (low grade Ta, multiple tumors)  ? ?Last upper tract imaging: Mar 2018 ([facility])  ?  ? ?NON-GU PMH: Bacteriuria (Stable) - 2021, (Stable), - 2019 (Stable), - 2019, - 2018 ?Diabetes Type 2 ?Diverticulitis ?Hypertension ?  ? ?FAMILY HISTORY: Bladder Cancer - No Family History ?Heart Disease - Father ?Kidney Cancer - No Family History  ? ?SOCIAL HISTORY: Marital Status: Single ?Preferred Language: En[place]nicity: Not Hispanic Or Latino; Race: White ?Current Smoking Status: Patient smokes.  ?Does drink.  ?Drinks 1 caffeinated drink per day. ?  ?  Notes: Current every day smoker, Marital History - Single, Tobacco Use, Alcohol Use, Occupation:  ? ?REVIEW OF SYSTEMS:    ?GU Review Female:   Patient denies  frequent urination, hard to postpone urination, burning /pain with urination, get up at night to urinate, leakage of urine, stream starts and stops, trouble starting your stream, have to strain to urinate, and currently pregnant.  ?Gastrointestinal (Lower):   Patient denies diarrhea and constipation.  ?Gastrointestinal (Upper):    Patient denies nausea and vomiting.  ?Constitutional:   Patient denies fever, night sweats, weight loss, and fatigue.  ?Skin:   Patient denies skin rash/ lesion and itching.  ?Eyes:   Patient denies blurred vision and double vision.  ?Ears/ Nose/ Throat:   Patient denies sore throat and sinus problems.  ?Hematologic/Lymphatic:   Patient denies swollen glands and easy bruising.  ?Cardiovascular:   Patient denies leg swelling and chest pains.  ?Respiratory:   Patient denies cough and shortness of breath.  ?Endocrine:   Patient denies excessive thirst.  ?Musculoskeletal:   Patient denies back pain and joint pain.  ?Neurological:   Patient denies headaches and dizziness.  ?Psychologic:   Patient denies depression and anxiety.  ? ?VITAL SIGNS:    ? ?Weight 160 lb / 72.57 kg  ?BMI 31.2 kg/m?  ? ?GU PHYSICAL EXAMINATION:    ?Urethral Meatus: Normal size. Normal position. No discharge.  ? ?MULTI-SYSTEM PHYSICAL EXAMINATION:    ?Constitutional: Well-nourished. No physical deformities. Normally developed. Good grooming.  ?Respiratory: No labored breathing, no use of accessory muscles. Clear bilaterally.  ?Cardiovascular: Normal temperature, normal extremity pulses, no swelling, no varicosities. Regular rate and rhythm.  ? ?  ?Complexity of Data:  ?Records Review:   Previous Patient Records  ? ? ? ? ? ?Notes: qns to spin ?  ? ?ASSESSMENT:  ?    ICD-10 Details  ?1 GU:   Bladder Cancer overlapping sites - C67.8   ? ?PLAN:    ? ?      Schedule ?Return Visit/Planned Activity: Other See Visit Notes  ?           Note: Will call to schedule surgery.  ? ? ?      Document ?Letter(s):  Created for Patient: Clinical Summary  ? ? ?     Notes:   1. Bladder cancer: We discussed her findings at cystoscopy. She does appear to have at least a single small tumor. This does measure less than 1 cm. She also has multiple other areas that do raise some concern. A bladder washing has been obtained for cytology. I recommended that she proceed with  cystoscopy and biopsy/transurethral resection, and fulguration versus removal of her other lesions if they look more suspicious at the time of her procedure.  ? ?We have reviewed the potential risks, complications, and expected recovery process associated with this procedure. She gives informed consent. Her renal function will be checked preoperatively.  ? ?    ?

## 2021-09-04 ENCOUNTER — Encounter (HOSPITAL_COMMUNITY): Payer: Self-pay | Admitting: Urology

## 2021-09-04 ENCOUNTER — Ambulatory Visit (HOSPITAL_COMMUNITY): Payer: Medicare HMO | Admitting: Physician Assistant

## 2021-09-04 ENCOUNTER — Encounter (HOSPITAL_COMMUNITY)
Admission: RE | Disposition: A | Discharge status: Home / Self Care | Payer: Self-pay | Source: Home / Self Care | Attending: Urology

## 2021-09-04 ENCOUNTER — Ambulatory Visit (HOSPITAL_COMMUNITY)
Admission: RE | Admit: 2021-09-04 | Discharge: 2021-09-04 | Disposition: A | Discharge status: Home / Self Care | Payer: Medicare HMO | Attending: Urology | Admitting: Urology

## 2021-09-04 ENCOUNTER — Ambulatory Visit (HOSPITAL_BASED_OUTPATIENT_CLINIC_OR_DEPARTMENT_OTHER): Payer: Medicare HMO | Admitting: Physician Assistant

## 2021-09-04 ENCOUNTER — Other Ambulatory Visit: Payer: Self-pay

## 2021-09-04 DIAGNOSIS — Z79899 Other long term (current) drug therapy: Secondary | ICD-10-CM | POA: Diagnosis not present

## 2021-09-04 DIAGNOSIS — N3020 Other chronic cystitis without hematuria: Secondary | ICD-10-CM | POA: Insufficient documentation

## 2021-09-04 DIAGNOSIS — F17200 Nicotine dependence, unspecified, uncomplicated: Secondary | ICD-10-CM | POA: Insufficient documentation

## 2021-09-04 DIAGNOSIS — I10 Essential (primary) hypertension: Secondary | ICD-10-CM | POA: Insufficient documentation

## 2021-09-04 DIAGNOSIS — Z794 Long term (current) use of insulin: Secondary | ICD-10-CM

## 2021-09-04 DIAGNOSIS — D494 Neoplasm of unspecified behavior of bladder: Secondary | ICD-10-CM | POA: Diagnosis not present

## 2021-09-04 DIAGNOSIS — E1165 Type 2 diabetes mellitus with hyperglycemia: Secondary | ICD-10-CM

## 2021-09-04 DIAGNOSIS — C678 Malignant neoplasm of overlapping sites of bladder: Secondary | ICD-10-CM | POA: Diagnosis present

## 2021-09-04 DIAGNOSIS — F17210 Nicotine dependence, cigarettes, uncomplicated: Secondary | ICD-10-CM

## 2021-09-04 HISTORY — PX: TRANSURETHRAL RESECTION OF BLADDER TUMOR: SHX2575

## 2021-09-04 LAB — GLUCOSE, CAPILLARY
Glucose-Capillary: 111 mg/dL — ABNORMAL HIGH (ref 70–99)
Glucose-Capillary: 113 mg/dL — ABNORMAL HIGH (ref 70–99)

## 2021-09-04 SURGERY — TURBT (TRANSURETHRAL RESECTION OF BLADDER TUMOR)
Anesthesia: General | Site: Urethra

## 2021-09-04 MED ORDER — FENTANYL CITRATE PF 50 MCG/ML IJ SOSY: 25.0000 ug | PREFILLED_SYRINGE | INTRAMUSCULAR | Status: DC | PRN

## 2021-09-04 MED ORDER — ONDANSETRON HCL 4 MG/2ML IJ SOLN
INTRAMUSCULAR | Status: DC | PRN
  Administered 2021-09-04: 4 mg via INTRAVENOUS

## 2021-09-04 MED ORDER — PHENYLEPHRINE HCL (PRESSORS) 10 MG/ML IV SOLN
INTRAVENOUS | Status: DC | PRN
  Administered 2021-09-04 (×3): 80 ug via INTRAVENOUS

## 2021-09-04 MED ORDER — STERILE WATER FOR IRRIGATION IR SOLN
Status: DC | PRN
  Administered 2021-09-04: 1000 mL

## 2021-09-04 MED ORDER — PROPOFOL 10 MG/ML IV BOLUS
INTRAVENOUS | Status: DC | PRN
  Administered 2021-09-04: 130 mg via INTRAVENOUS

## 2021-09-04 MED ORDER — LACTATED RINGERS IV SOLN: INTRAVENOUS | Status: DC

## 2021-09-04 MED ORDER — CHLORHEXIDINE GLUCONATE 0.12 % MT SOLN
15.0000 mL | Freq: Once | OROMUCOSAL | Status: AC
  Administered 2021-09-04: 15 mL via OROMUCOSAL

## 2021-09-04 MED ORDER — DEXAMETHASONE SODIUM PHOSPHATE 10 MG/ML IJ SOLN
INTRAMUSCULAR | Status: AC
  Filled 2021-09-04: qty 1

## 2021-09-04 MED ORDER — ONDANSETRON HCL 4 MG/2ML IJ SOLN: 4.0000 mg | Freq: Once | INTRAMUSCULAR | Status: DC | PRN

## 2021-09-04 MED ORDER — 0.9 % SODIUM CHLORIDE (POUR BTL) OPTIME
TOPICAL | Status: DC | PRN
  Administered 2021-09-04: 1000 mL

## 2021-09-04 MED ORDER — ORAL CARE MOUTH RINSE: 15.0000 mL | Freq: Once | OROMUCOSAL | Status: AC

## 2021-09-04 MED ORDER — LIDOCAINE HCL (PF) 2 % IJ SOLN
INTRAMUSCULAR | Status: AC
  Filled 2021-09-04: qty 5

## 2021-09-04 MED ORDER — ACETAMINOPHEN 10 MG/ML IV SOLN: 1000.0000 mg | Freq: Once | INTRAVENOUS | Status: DC | PRN

## 2021-09-04 MED ORDER — CEFAZOLIN SODIUM-DEXTROSE 2-4 GM/100ML-% IV SOLN
2.0000 g | Freq: Once | INTRAVENOUS | Status: AC
  Administered 2021-09-04: 2 g via INTRAVENOUS
  Filled 2021-09-04: qty 100

## 2021-09-04 MED ORDER — FENTANYL CITRATE (PF) 100 MCG/2ML IJ SOLN
INTRAMUSCULAR | Status: DC | PRN
  Administered 2021-09-04 (×2): 25 ug via INTRAVENOUS

## 2021-09-04 MED ORDER — FENTANYL CITRATE (PF) 100 MCG/2ML IJ SOLN
INTRAMUSCULAR | Status: AC
  Filled 2021-09-04: qty 2

## 2021-09-04 MED ORDER — PHENAZOPYRIDINE HCL 100 MG PO TABS: 100.0000 mg | ORAL_TABLET | Freq: Three times a day (TID) | ORAL | 0 refills | Status: AC | PRN

## 2021-09-04 MED ORDER — LIDOCAINE 2% (20 MG/ML) 5 ML SYRINGE
INTRAMUSCULAR | Status: DC | PRN
  Administered 2021-09-04: 100 mg via INTRAVENOUS

## 2021-09-04 MED ORDER — DEXAMETHASONE SODIUM PHOSPHATE 10 MG/ML IJ SOLN
INTRAMUSCULAR | Status: DC | PRN
  Administered 2021-09-04: 4 mg via INTRAVENOUS

## 2021-09-04 MED ORDER — ONDANSETRON HCL 4 MG/2ML IJ SOLN
INTRAMUSCULAR | Status: AC
  Filled 2021-09-04: qty 2

## 2021-09-04 SURGICAL SUPPLY — 18 items
BAG URINE DRAIN 2000ML AR STRL (UROLOGICAL SUPPLIES) IMPLANT
BAG URO CATCHER STRL LF (MISCELLANEOUS) ×2 IMPLANT
CNTNR URN SCR LID CUP LEK RST (MISCELLANEOUS) IMPLANT
CONT SPEC 4OZ STRL OR WHT (MISCELLANEOUS) ×1
DRAPE FOOT SWITCH (DRAPES) ×2 IMPLANT
ELECT REM PT RETURN 15FT ADLT (MISCELLANEOUS) ×2 IMPLANT
GLOVE SURG LX 7.5 STRW (GLOVE) ×3
GLOVE SURG LX STRL 7.5 STRW (GLOVE) ×1 IMPLANT
GOWN STRL REUS W/TWL LRG LVL3 (GOWN DISPOSABLE) ×2 IMPLANT
KIT TURNOVER KIT A (KITS) ×1 IMPLANT
LOOP CUT BIPOLAR 24F LRG (ELECTROSURGICAL) IMPLANT
MANIFOLD NEPTUNE II (INSTRUMENTS) ×2 IMPLANT
PACK CYSTO (CUSTOM PROCEDURE TRAY) ×2 IMPLANT
PENCIL SMOKE EVACUATOR (MISCELLANEOUS) IMPLANT
SYR TOOMEY IRRIG 70ML (MISCELLANEOUS)
SYRINGE TOOMEY IRRIG 70ML (MISCELLANEOUS) IMPLANT
TUBING CONNECTING 10 (TUBING) ×2 IMPLANT
TUBING UROLOGY SET (TUBING) ×2 IMPLANT

## 2021-09-04 NOTE — Discharge Instructions (Signed)
You may see some blood in the urine and may have some burning with urination for 48-72 hours. You also may notice that you have to urinate more frequently or urgently after your procedure which is normal.  You should call should you develop an inability urinate, fever > 101, persistent nausea and vomiting that prevents you from eating or drinking to stay hydrated.    

## 2021-09-04 NOTE — Anesthesia Procedure Notes (Signed)
Procedure Name: LMA Insertion ?Date/Time: 09/04/2021 9:56 AM ?Performed by: Lavina Hamman, CRNA ?Pre-anesthesia Checklist: Patient identified, Emergency Drugs available, Suction available and Patient being monitored ?Patient Re-evaluated:Patient Re-evaluated prior to induction ?Oxygen Delivery Method: Circle System Utilized ?Preoxygenation: Pre-oxygenation with 100% oxygen ?Induction Type: IV induction ?Ventilation: Mask ventilation without difficulty ?LMA: LMA with gastric port inserted ?LMA Size: 4.0 ?Number of attempts: 1 ?Airway Equipment and Method: Bite block ?Placement Confirmation: positive ETCO2 ?Tube secured with: Tape ?Dental Injury: Teeth and Oropharynx as per pre-operative assessment  ? ? ? ? ?

## 2021-09-04 NOTE — Op Note (Signed)
Preoperative diagnosis: Bladder tumor (1 cm) ? ?Postoperative diagnosis: Bladder tumor (1 cm) ? ?Procedures: ?1.  Cystoscopy ?2.  Transurethral resection of bladder tumor (1 cm) ? ?Surgeon: Pryor Curia MD ? ?Anesthesia: General ? ?Complications: None ? ?EBL: Minimal ? ?Specimens: ?1.  Tumor from bladder dome ?2.  Biopsy of right posterior bladder ? ?Disposition of specimens: Pathology ? ?Indication: Jill Fox is a 71 year old female with a history of urothelial carcinoma the bladder who presents today after having a new small 1 cm tumor noted at the bladder dome on surveillance cystoscopy.  After reviewing options for management, she elected to proceed with the above procedures.  The potential risks, complications, and the expected recovery process was discussed in detail.  Informed consent was obtained. ? ?Description of procedure: The patient was taken the operating room and a general anesthetic was administered.  She was given preoperative antibiotics, placed in the dorsolithotomy position, and prepped and draped in usual sterile fashion.  Next, a preoperative timeout was performed. ? ?Cystourethroscopy was then performed and the bladder was systematically examined with both a 30 degree and 70 degree lens.  Her left ureteral orifice was displaced laterally and posteriorly consistent with her history of a left ureteral reimplantation in the past.  There was no blood noted from either the right native ureteral orifice or the reimplanted left ureteral orifice.  There was noted to be a small papillary 1 cm tumor in the midline of the bladder dome.  There was also an area of abnormal mucosa in the right posterior bladder although no definitive tumor was identified.  No other mucosal abnormalities were noted.  Utilizing a cold cup biopsy forceps, the 1 cm tumor was transurethrally resected in its entirety.  The right posterior abnormal mucosal area was then biopsied.  The specimens were sent for permanent  pathologic analysis.  A Bugbee electrode was used to fulgurate these areas to achieve adequate hemostasis.  The bladder was emptied and the procedure was ended.  She tolerated the procedure well without complications.  She was able to be awakened and transferred to recovery unit in satisfactory condition. ?

## 2021-09-04 NOTE — Anesthesia Postprocedure Evaluation (Signed)
Anesthesia Post Note ? ?Patient: SHANTELLE ALLES ? ?Procedure(s) Performed: TRANSURETHRAL RESECTION OF BLADDER TUMOR (TURBT) WITH CYSTOSCOPY (Urethra) ? ?  ? ?Patient location during evaluation: PACU ?Anesthesia Type: General ?Level of consciousness: awake and alert ?Pain management: pain level controlled ?Vital Signs Assessment: post-procedure vital signs reviewed and stable ?Respiratory status: spontaneous breathing, nonlabored ventilation, respiratory function stable and patient connected to nasal cannula oxygen ?Cardiovascular status: blood pressure returned to baseline and stable ?Postop Assessment: no apparent nausea or vomiting ?Anesthetic complications: no ? ? ?No notable events documented. ? ?Last Vitals:  ?Vitals:  ? 09/04/21 1030 09/04/21 1045  ?BP: 130/66 134/70  ?Pulse: 72 76  ?Resp: 13 16  ?Temp:  (!) 36.3 ?C  ?SpO2: 100% 93%  ?  ?Last Pain:  ?Vitals:  ? 09/04/21 1045  ?TempSrc:   ?PainSc: 0-No pain  ? ? ?  ?  ?  ?  ?  ?  ? ?Inetta Dicke S ? ? ? ? ?

## 2021-09-04 NOTE — Transfer of Care (Signed)
Immediate Anesthesia Transfer of Care Note ? ?Patient: Jill Fox ? ?Procedure(s) Performed: Procedure(s): ?TRANSURETHRAL RESECTION OF BLADDER TUMOR (TURBT) WITH CYSTOSCOPY (N/A) ? ?Patient Location: PACU ? ?Anesthesia Type:General ? ?Level of Consciousness:  sedated, patient cooperative and responds to stimulation ? ?Airway & Oxygen Therapy:Patient Spontanous Breathing and Patient connected to face mask oxgen ? ?Post-op Assessment:  Report given to PACU RN and Post -op Vital signs reviewed and stable ? ?Post vital signs:  Reviewed and stable ? ?Last Vitals:  ?Vitals:  ? 09/04/21 0710  ?BP: 123/63  ?Pulse: 78  ?Resp: 16  ?Temp: 36.6 ?C  ?SpO2: 94%  ? ? ?Complications: No apparent anesthesia complications ? ?

## 2021-09-05 ENCOUNTER — Encounter (HOSPITAL_COMMUNITY): Payer: Self-pay | Admitting: Urology

## 2021-09-05 LAB — SURGICAL PATHOLOGY

## 2022-08-05 ENCOUNTER — Other Ambulatory Visit (HOSPITAL_BASED_OUTPATIENT_CLINIC_OR_DEPARTMENT_OTHER): Payer: Self-pay

## 2022-08-06 ENCOUNTER — Other Ambulatory Visit (HOSPITAL_COMMUNITY): Payer: Self-pay

## 2022-08-06 MED ORDER — MOUNJARO 7.5 MG/0.5ML ~~LOC~~ SOAJ
7.5000 mg | SUBCUTANEOUS | 2 refills | Status: AC
  Filled 2022-08-06: qty 2, 28d supply, fill #0

## 2023-02-17 ENCOUNTER — Other Ambulatory Visit: Payer: Self-pay | Admitting: Family

## 2023-02-17 DIAGNOSIS — Z1231 Encounter for screening mammogram for malignant neoplasm of breast: Secondary | ICD-10-CM

## 2023-02-22 ENCOUNTER — Ambulatory Visit
Admission: RE | Admit: 2023-02-22 | Discharge: 2023-02-22 | Disposition: A | Discharge status: Home / Self Care | Payer: Medicare HMO | Source: Ambulatory Visit | Attending: Family | Admitting: Family

## 2023-02-22 DIAGNOSIS — Z1231 Encounter for screening mammogram for malignant neoplasm of breast: Secondary | ICD-10-CM

## 2023-10-26 ENCOUNTER — Other Ambulatory Visit: Payer: Self-pay | Admitting: Urology

## 2023-11-04 NOTE — Patient Instructions (Addendum)
 SURGICAL WAITING ROOM VISITATION  Patients having surgery or a procedure may have no more than 2 support people in the waiting area - these visitors may rotate.    Children under the age of 71 must have an adult with them who is not the patient.  Visitors with respiratory illnesses are discouraged from visiting and should remain at home.  If the patient needs to stay at the hospital during part of their recovery, the visitor guidelines for inpatient rooms apply. Pre-op nurse will coordinate an appropriate time for 1 support person to accompany patient in pre-op.  This support person may not rotate.    Please refer to the [facility] website for the visitor guidelines for Inpatients (after your surgery is over and you are in a regular room).       Your procedure is scheduled on: 11/08/23   Report to [facility] Main Entrance    Report to admitting at 11:45AM   Call this number if you have problems the morning of surgery [phone]   Do not eat food :After Midnight.   After Midnight you may have the following liquids until 8 AM DAY OF SURGERY  Water               Gatorade(no red gatorade)      Oral Hygiene is also important to reduce your risk of infection.                                    Remember - BRUSH YOUR TEETH THE MORNING OF SURGERY WITH YOUR REGULAR TOOTHPASTE   Stop all vitamins and herbal supplements 7 days before surgery.   Take these medicines the morning of surgery with A SIP OF WATER : atenolol, gabapentin, oxycodone  if needed, pyridium , rosuvastatin  DO NOT TAKE ANY ORAL DIABETIC MEDICATIONS DAY OF YOUR SURGERY Hold Farxiga for 72 hours prior to surgery. Last dose to be6/26/25             You may not have any metal on your body including hair pins, jewelry, and body piercing             Do not wear make-up, lotions, powders, perfumes/cologne, or deodorant  Do not wear nail polish including gel and S&S, artificial/acrylic nails, or any other type of  covering on natural nails including finger and toenails. If you have artificial nails, gel coating, etc. that needs to be removed by a nail salon please have this removed prior to surgery or surgery may need to be canceled/ delayed if the surgeon/ anesthesia feels like they are unable to be safely monitored.   Do not shave  48 hours prior to surgery.   Do not smoke after midnight    Do not bring valuables to the hospital. [facility] IS NOT             RESPONSIBLE   FOR VALUABLES.   Contacts, glasses, dentures or bridgework may not be worn into surgery.   DO NOT BRING YOUR HOME MEDICATIONS TO THE HOSPITAL. PHARMACY WILL DISPENSE MEDICATIONS LISTED ON YOUR MEDICATION LIST TO YOU DURING YOUR ADMISSION IN THE HOSPITAL!    Patients discharged on the day of surgery will not be allowed to drive home.  Someone NEEDS to stay with you for the first 24 hours after anesthesia.   Special Instructions: Bring a copy of your healthcare power of attorney and living will documents the day  of surgery if you haven't scanned them before.              Please read over the following fact sheets you were given: IF YOU HAVE QUESTIONS ABOUT YOUR PRE-OP INSTRUCTIONS PLEASE CALL [phone] [place]  If You received a COVID test during your pre-op visit  it is requested that you wear a mask when out in public, stay away from anyone that may not be feeling well and notify your surgeon if you develop symptoms. If you test positive for Covid or have been in contact with anyone that has tested positive in the last 10 days please notify you surgeon.    [facility] - Preparing for Surgery Before surgery, you can play an important role.  Because skin is not sterile, your skin needs to be as free of germs as possible.  You can reduce the number of germs on your skin by washing with CHG (chlorahexidine gluconate) soap before surgery.  CHG is an antiseptic cleaner which kills germs and bonds with the skin to continue killing  germs even after washing. Please DO NOT use if you have an allergy to CHG or antibacterial soaps.  If your skin becomes reddened/irritated stop using the CHG and inform your nurse when you arrive at Short Stay. Do not shave (including legs and underarms) for at least 48 hours prior to the first CHG shower.  You may shave your face/neck.  Please follow these instructions carefully:  1.  Shower with CHG Soap the night before surgery and the  morning of surgery.  2.  If you choose to wash your hair, wash your hair first as usual with your normal  shampoo.  3.  After you shampoo, rinse your hair and body thoroughly to remove the shampoo.                             4.  Use CHG as you would any other liquid soap.  You can apply chg directly to the skin and wash.  Gently with a scrungie or clean washcloth.  5.  Apply the CHG Soap to your body ONLY FROM THE NECK DOWN.   Do   not use on face/ open                           Wound or open sores. Avoid contact with eyes, ears mouth and   genitals (private parts).                       Wash face,  Genitals (private parts) with your normal soap.             6.  Wash thoroughly, paying special attention to the area where your    surgery  will be performed.  7.  Thoroughly rinse your body with warm water  from the neck down.  8.  DO NOT shower/wash with your normal soap after using and rinsing off the CHG Soap.                9.  Pat yourself dry with a clean towel.            10.  Wear clean pajamas.            11.  Place clean sheets on your bed the night of your first shower and do not  sleep with pets. Day of  Surgery : Do not apply any lotions/deodorants the morning of surgery.  Please wear clean clothes to the hospital/surgery center.  FAILURE TO FOLLOW THESE INSTRUCTIONS MAY RESULT IN THE CANCELLATION OF YOUR SURGERY   _How to Manage Your Diabetes Before and After Surgery  Why is it important to control my blood sugar before and after  surgery? Improving blood sugar levels before and after surgery helps healing and can limit problems. A way of improving blood sugar control is eating a healthy diet by:  Eating less sugar and carbohydrates  Increasing activity/exercise  Talking with your doctor about reaching your blood sugar goals High blood sugars (greater than 180 mg/dL) can raise your risk of infections and slow your recovery, so you will need to focus on controlling your diabetes during the weeks before surgery. Make sure that the doctor who takes care of your diabetes knows about your planned surgery including the date and location.  How do I manage my blood sugar before surgery? Check your blood sugar at least 4 times a day, starting 2 days before surgery, to make sure that the level is not too high or low. Check your blood sugar the morning of your surgery when you wake up and every 2 hours until you get to the Short Stay unit. If your blood sugar is less than 70 mg/dL, you will need to treat for low blood sugar: Do not take insulin. Treat a low blood sugar (less than 70 mg/dL) with  cup of clear juice (cranberry or apple), 4 glucose tablets, OR glucose gel. Recheck blood sugar in 15 minutes after treatment (to make sure it is greater than 70 mg/dL). If your blood sugar is not greater than 70 mg/dL on recheck, call [phone] for further instructions. Report your blood sugar to the short stay nurse when you get to Short Stay.  If you are admitted to the hospital after surgery: Your blood sugar will be checked by the staff and you will probably be given insulin after surgery (instead of oral diabetes medicines) to make sure you have good blood sugar levels. The goal for blood sugar control after surgery is 80-180 mg/dL.   WHAT DO I DO ABOUT MY DIABETES MEDICATION?  Do not take oral diabetes medicines (pills) the morning of surgery. Hold Farxiga for 72 hours.  THE NIGHT BEFORE SURGERY, take half of your dose of  Tresiba       DO NOT TAKE THE FOLLOWING 7 DAYS PRIOR TO SURGERY: Ozempic, Wegovy, Rybelsus (Semaglutide), Byetta (exenatide), Bydureon (exenatide ER), Victoza, Saxenda (liraglutide), or Trulicity (dulaglutide) Mounjaro  (Tirzepatide ) Adlyxin (Lixisenatide), Polyethylene Glycol Loxenatide.  Patient Signature:  Date:   Nurse Signature:  Date:   Reviewed and Endorsed by [facility] Patient Education Committee, August 2015

## 2023-11-04 NOTE — Progress Notes (Addendum)
 COVID Vaccine received:  []  No [x]  Yes Date of any COVID positive Test in last 90 days: no PCP - Angeline Iba NP in [place] Cardiologist -   Chest x-ray -  EKG -   Stress Test -  ECHO -  Cardiac Cath -   Bowel Prep - [x]  No  []   Yes ______  Pacemaker / ICD device [x]  No []  Yes   Spinal Cord Stimulator:[x]  No []  Yes       History of Sleep Apnea? [x]  No []  Yes   CPAP used?- [x]  No []  Yes    Does the patient monitor blood sugar?          []  No [x]  Yes  []  N/A  Patient has: []  NO Hx DM   []  Pre-DM                 []  DM1  [x]   DM2 Does patient have a [employer] or Dexacom? [x]  No []  Yes   Fasting Blood Sugar Ranges- 140's Checks Blood Sugar __2___ times a day  GLP1 agonist / usual dose - Mounjaro  last dose 10/23/23 GLP1 instructions:  SGLT-2 inhibitors / usual dose - no SGLT-2 instructions:   Blood Thinner / Instructions: Aspirin Instructions:ASA 81 mg stopped 10/14/21/25  Comments:   Activity level: Patient is unable to climb a flight of stairs without difficulty; [x]  No CP  [x]  No SOB, but would have _hip dysfunction and lower back pain__   Patient can perform ADLs without assistance.   Anesthesia review:   Patient denies shortness of breath, fever, cough and chest pain at PAT appointment.  Patient verbalized understanding and agreement to the Pre-Surgical Instructions that were given to them at this PAT appointment. Patient was also educated of the need to review these PAT instructions again prior to his/her surgery.I reviewed the appropriate phone numbers to call if they have any and questions or concerns.

## 2023-11-05 ENCOUNTER — Other Ambulatory Visit: Payer: Self-pay

## 2023-11-05 ENCOUNTER — Encounter (HOSPITAL_COMMUNITY)
Admission: RE | Admit: 2023-11-05 | Discharge: 2023-11-05 | Disposition: A | Discharge status: Home / Self Care | Source: Ambulatory Visit | Attending: Urology | Admitting: Urology

## 2023-11-05 ENCOUNTER — Encounter (HOSPITAL_COMMUNITY): Payer: Self-pay

## 2023-11-05 VITALS — BP 133/87 | HR 84 | Temp 98.3°F | Resp 16 | Ht 60.0 in | Wt 110.0 lb

## 2023-11-05 DIAGNOSIS — E119 Type 2 diabetes mellitus without complications: Secondary | ICD-10-CM | POA: Insufficient documentation

## 2023-11-05 DIAGNOSIS — Z794 Long term (current) use of insulin: Secondary | ICD-10-CM | POA: Insufficient documentation

## 2023-11-05 DIAGNOSIS — Z01818 Encounter for other preprocedural examination: Secondary | ICD-10-CM | POA: Diagnosis present

## 2023-11-05 DIAGNOSIS — I10 Essential (primary) hypertension: Secondary | ICD-10-CM | POA: Diagnosis not present

## 2023-11-05 LAB — BASIC METABOLIC PANEL WITH GFR
Anion gap: 10 (ref 5–15)
BUN: 17 mg/dL (ref 8–23)
CO2: 28 mmol/L (ref 22–32)
Calcium: 9 mg/dL (ref 8.9–10.3)
Chloride: 102 mmol/L (ref 98–111)
Creatinine, Ser: 0.81 mg/dL (ref 0.44–1.00)
GFR, Estimated: >60 mL/min (ref >60)
Glucose, Bld: 140 mg/dL — ABNORMAL HIGH (ref 70–99)
Potassium: 2.8 mmol/L — ABNORMAL LOW (ref 3.5–5.1)
Sodium: 140 mmol/L (ref 135–145)

## 2023-11-05 LAB — CBC
HCT: 41.4 % (ref 36.0–46.0)
Hemoglobin: 13.2 g/dL (ref 12.0–15.0)
MCH: 27.6 pg (ref 26.0–34.0)
MCHC: 31.9 g/dL (ref 30.0–36.0)
MCV: 86.4 fL (ref 80.0–100.0)
Platelets: 235 10*3/uL (ref 150–400)
RBC: 4.79 MIL/uL (ref 3.87–5.11)
RDW: 14.4 % (ref 11.5–15.5)
WBC: 11.7 10*3/uL — ABNORMAL HIGH (ref 4.0–10.5)
nRBC: 0 % (ref 0.0–0.2)

## 2023-11-05 LAB — GLUCOSE, CAPILLARY: Glucose-Capillary: 137 mg/dL — ABNORMAL HIGH (ref 70–99)

## 2023-11-05 LAB — HEMOGLOBIN A1C
Hgb A1c MFr Bld: 7 % — ABNORMAL HIGH (ref 4.8–5.6)
Mean Plasma Glucose: 154.2 mg/dL

## 2023-11-05 NOTE — Progress Notes (Addendum)
 Anesthesia Chart Review   Case: [id number] Date/Time: 11/08/23 1345   Procedures:      TURBT (TRANSURETHRAL RESECTION OF BLADDER TUMOR)     CYSTOSCOPY, WITH RETROGRADE PYELOGRAM (Bilateral)   Anesthesia type: General   Diagnosis: Malignant neoplasm of overlapping sites of bladder (HCC) [C67.8]   Pre-op diagnosis: BLADDER CANCER   Location: [facility]   Surgeons: Jill Glance, MD       DISCUSSION:72 y.o. smoker with h/o HTN, DM II, CKD, bladder cancer scheduled for above procedure 11/08/2023 with Dr. Glance Jill.   Potassium 2.8 at PAT visit. Labs forwarded to PCP and surgeon. Spoke with patient, has had hypokalemia in the past.  Does have prescribed potassium chloride, she will start this until surgery.  Will recheck DOS.   Pt reports she is able to climb a flight of stairs without shortness of breath or chest pain.  VS: BP 133/87   Pulse 84   Temp 36.8 C (Oral)   Resp 16   Ht 5' (1.524 m)   Wt 49.9 kg   SpO2 96%   BMI 21.48 kg/m   PROVIDERS: Jill Angeline FALCON, NP is PCP    LABS: Labs reviewed: Acceptable for surgery. (all labs ordered are listed, but only abnormal results are displayed)  Labs Reviewed  BASIC METABOLIC PANEL WITH GFR - Abnormal; Notable for the following components:      Result Value   Potassium 2.8 (*)    Glucose, Bld 140 (*)    All other components within normal limits  CBC - Abnormal; Notable for the following components:   WBC 11.7 (*)    All other components within normal limits  GLUCOSE, CAPILLARY - Abnormal; Notable for the following components:   Glucose-Capillary 137 (*)    All other components within normal limits  HEMOGLOBIN A1C     IMAGES:   EKG:   CV:  Past Medical History:  Diagnosis Date   Arthritis    Cancer (HCC)    bladder   Chronic kidney disease    bladder cancer    Colovaginal fistula    2012   Diabetes mellitus    TYPE 2    Diverticulitis    Heart murmur    Hyperlipidemia    Hypertension      Past Surgical History:  Procedure Laterality Date   APPENDECTOMY     BLADDER SURGERY     COLON SURGERY  10/03/2010   ostomy reversal   COLOSTOMY     hx of    CYSTOSCOPY N/A 09/27/2017   Procedure: BLUE LIGHT CYSTOSCOPY WITH CYSVIEW;  Surgeon: Jill Glance, MD;  Location: [facility];  Service: Urology;  Laterality: N/A;  GENERAL ANESTHESIA WITH PARALYSIS   CYSTOSCOPY W/ RETROGRADES  05/25/2011   Procedure: CYSTOSCOPY WITH RETROGRADE PYELOGRAM;  Surgeon: Jill Renda, MD;  Location: [facility];  Service: Urology;  Laterality: Bilateral;  C-ARM   CYSTOSCOPY W/ RETROGRADES Bilateral 07/14/2012   Procedure: CYSTOSCOPY WITH RETROGRADE PYELOGRAM;  Surgeon: Jill Renda, MD;  [facility];  Service: Urology;  Laterality: Bilateral;   CYSTOSCOPY W/ RETROGRADES Bilateral 08/27/2014   Procedure: CYSTOSCOPY WITH RETROGRADE PYELOGRAM;  Surgeon: Fox Renda, MD;  [facility];  Service: Urology;  Laterality: Bilateral;   CYSTOSCOPY W/ RETROGRADES N/A 07/23/2016   Procedure: CYSTOSCOPY WITH RETROGRADE PYELOGRAM;  Surgeon: Fox Renda, MD;  [facility];  Service: Urology;  Laterality: N/A;   TRANSURETHRAL RESECTION OF BLADDER TUMOR  05/25/2011   Procedure: TRANSURETHRAL RESECTION OF  BLADDER TUMOR (TURBT);  Surgeon: Jill Ferrara, MD;  [facility]: [facility];  Service: Urology;  Laterality: N/A;   TRANSURETHRAL RESECTION OF BLADDER TUMOR N/A 07/14/2012   Procedure: TRANSURETHRAL RESECTION OF BLADDER TUMOR (TURBT);  Surgeon: Jill Ferrara, MD;  [facility];  Service: Urology;  Laterality: N/A;  with bladder fulguration   TRANSURETHRAL RESECTION OF BLADDER TUMOR N/A 08/27/2014   Procedure: TRANSURETHRAL RESECTION OF BLADDER TUMOR (TURBT);  Surgeon: Jill Ferrara, MD;  [facility];  Service: Urology;  Laterality: N/A;   TRANSURETHRAL RESECTION OF BLADDER TUMOR N/A 07/23/2016   Procedure: TRANSURETHRAL RESECTION OF BLADDER TUMOR (TURBT);  Surgeon: Jill Ferrara, MD;  [facility];  Service: Urology;   Laterality: N/A;   TRANSURETHRAL RESECTION OF BLADDER TUMOR N/A 09/27/2017   Procedure: TRANSURETHRAL RESECTION OF BLADDER TUMOR (TURBT);  Surgeon: Fox Gretel, MD;  [facility];  Service: Urology;  Laterality: N/A;  GENERAL ANESTHESIA WITH PARALYSIS   TRANSURETHRAL RESECTION OF BLADDER TUMOR N/A 09/04/2021   Procedure: TRANSURETHRAL RESECTION OF BLADDER TUMOR (TURBT) WITH CYSTOSCOPY;  Surgeon: Fox Gretel, MD;  Location[facility];  Service: Urology;  Laterality: N/A;   TUBAL LIGATION     vesicoureteral reimplantation      MEDICATIONS:  amitriptyline (ELAVIL) 50 MG tablet   aspirin EC 81 MG tablet   atenolol (TENORMIN) 25 MG tablet   FARXIGA 10 MG TABS tablet   gabapentin (NEURONTIN) 300 MG capsule   Multiple Vitamin (MULTIVITAMIN WITH MINERALS) TABS tablet   oxyCODONE  (OXY IR/ROXICODONE ) 5 MG immediate release tablet   phenazopyridine  (PYRIDIUM ) 100 MG tablet   rosuvastatin (CRESTOR) 20 MG tablet   tirzepatide  (MOUNJARO ) 10 MG/0.5ML Pen   tirzepatide  (MOUNJARO ) 7.5 MG/0.5ML Pen   TRESIBA FLEXTOUCH 200 UNIT/ML FlexTouch Pen   No current facility-administered medications for this encounter.     [name], PA-C [facility] [phone]

## 2023-11-05 NOTE — H&P (Signed)
 Office Visit Report     10/15/2023   --------------------------------------------------------------------------------   Jill Fox  MRN: [id number]  DOB: Apr 12, 1951, 73 year old Female  SSN: [id number]   PRIMARY CARE:  Angeline PHEBE Silvano Cornelious, NP  PRIMARY CARE FAX:  [phone]  REFERRING:  Gretel Renda Mickey CHRISTELLA  PROVIDER:  Gretel Renda, M.D.  LOCATION:  [facility] [phone]     --------------------------------------------------------------------------------   CC/HPI: Bladder cancer   Ms. Colocho follows up today for cystoscopic surveillance of her bladder cancer. She was last seen in February 2024. She denies any recent hematuria. Unfortunately, she did lose her partner of 25 years in January to a massive heart attack. This is caused her to be somewhat depressed and she has increased her smoking.     ALLERGIES: No Allergies    MEDICATIONS: Percocet  ALPRAZolam 0.5 MG Oral Tablet Oral  Amitriptyline Hcl  Aspirin 325 MG Oral Tablet Oral  Atenolol 25 MG Tablet Oral  clonazePAM 1 MG Tablet Oral  Escitalopram Oxalate 5 MG Tablet Oral  Gabapentin 300 MG Oral Capsule Oral  glipiZIDE 10 MG Tablet Oral  Ibuprofen 600 MG Tablet Oral  Lexapro 10 MG Tablet Oral  Lisinopril-hydroCHLOROthiazide 20-25 MG Tablet Oral  Nystatin 100000 UNIT/GM Cream External  Pravastatin Sodium 40 MG Tablet Oral  Rosuvastatin Calcium  Rybelsus 7 MG Tablet  Victoza 18 MG/3ML Solution Pen-injector Subcutaneous     GU PSH: Cysto Bladder Ureth Biopsy - 2011 Cystoscopy - 06/12/2022, 2023, 2022, 2021, 2021, 2020, 2020, 2019, 2019, 2019, 2018, 2018, 2017 Cystoscopy Insert Stent - 2012 Cystoscopy TURBT <2 cm - 2023, 2018, 2014, 2013 Cystoscopy TURBT 2-5 cm - 2019, 2016       PSH Notes: Cystoscopy With Fulguration Medium Lesion (2-5cm), Cystoscopy With Fulguration Small Lesion (5-50mm), Cystoscopy With Fulguration Small Lesion (5-95mm), Cystoscopy With Insertion Of Ureteral Stent  Left, Closure Of Enterovesical Fistula, Cystoscopy With Biopsy, Vesicoureteral Reimplantation, Partial Colectomy, Tubal Ligation   NON-GU PSH: Partial colectomy - 2011 Repair Bowel-bladder Fistula - 2012 Tubal Ligation - 2011     GU PMH: History of bladder cancer - 06/12/2022, - 2022, Bladder Cancer, - 2014 Bladder Cancer overlapping sites - 2023, - 2023, Malignant neoplasm of overlapping sites of bladder, - 2017 Acute Cystitis/UTI - 2019 Urinary Tract Inf, Unspec site, Pyuria - 2015, Urinary tract infection, - 2014 Mixed incontinence, Urge and stress incontinence - 2014 Nocturia, Nocturia - 2014 Urinary Frequency, Increased urinary frequency - 2014 Vesicointestinal fistula, Colovesical fistula - 2014      PMH Notes:   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 She is s/p repair by Dr. Curvin and myself on 05/20/10.   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   Sep 2010: [facility]URBT at [facility] (low grade, Ta)  Jul 2011: TURBT by Dr. Excell (low grade Ta)  Jan 2013: TURBT (low grade Ta, multiple tumors)  Mar 2014: TURBT (low grade Ta, multiple tumors)  Apr 2016: TURBT (low grade Ta, multiple tumors)  Mar 2018: TURBT (low grade Ta, multiple tumors)  May 2019: BL TURBT (low grade Ta, multiple tumors)  Apr 2023: TURBT (low grade Ta, solitary at dome)   Last upper tract imaging: Mar 2018 ([facility])     NON-GU PMH: Bacteriuria (Stable) - 2021, (Stable), - 2019 (Stable), - 2019, - 2018 Diabetes Type 2  Diverticulitis Hypertension    FAMILY HISTORY: Bladder Cancer - No Family History Heart Disease - Father Kidney Cancer - No Family History   SOCIAL HISTORY: Marital Status: Single Preferred Language: English; Ethnicity: Not Hispanic Or Latino; Race: White Current Smoking Status: Patient smokes.  Does drink.   Drinks 1 caffeinated drink per day.     Notes: Current every day smoker, Marital History - Single, Tobacco Use, Alcohol Use, Occupation:   REVIEW OF SYSTEMS:    GU Review Female:   Patient denies frequent urination, hard to postpone urination, burning /pain with urination, get up at night to urinate, leakage of urine, stream starts and stops, trouble starting your stream, have to strain to urinate, and currently pregnant.  Gastrointestinal (Lower):   Patient denies diarrhea and constipation.  Gastrointestinal (Upper):   Patient denies nausea and vomiting.  Constitutional:   Patient denies fever, night sweats, weight loss, and fatigue.  Skin:   Patient denies skin rash/ lesion and itching.  Eyes:   Patient denies blurred vision and double vision.  Ears/ Nose/ Throat:   Patient denies sore throat and sinus problems.  Hematologic/Lymphatic:   Patient denies swollen glands and easy bruising.  Cardiovascular:   Patient denies leg swelling and chest pains.  Respiratory:   Patient denies cough and shortness of breath.  Endocrine:   Patient denies excessive thirst.  Musculoskeletal:   Patient denies back pain and joint pain.  Neurological:   Patient denies headaches and dizziness.  Psychologic:   Patient denies depression and anxiety.   VITAL SIGNS: None   GU PHYSICAL EXAMINATION:    Urethra: No tenderness, no mass, no scarring. No hypermobility. No leakage.   MULTI-SYSTEM PHYSICAL EXAMINATION:    Constitutional: Well-nourished. No physical deformities. Normally developed. Good grooming.  Respiratory: No labored breathing, no use of accessory muscles.   Cardiovascular: Normal temperature, normal extremity pulses, no swelling, no varicosities.     Complexity of Data:  Records Review:   Previous Patient Records   PROCEDURES:         Flexible Cystoscopy - 52000  Indication: Bladder cancer  Risks, benefits, and some of the potential complications of the procedure were discussed at length  with the patient including infection, bleeding, voiding discomfort, urinary retention, fever, chills, sepsis, and others. All questions were answered. Informed consent was obtained. Antibiotic prophylaxis was given. Sterile technique and intraurethral analgesia were used.  Meatus:  Normal size. Normal location. Normal condition.  Urethra:  No hypermobility. No leakage.  Ureteral Orifices:  Normal location. Normal size. Normal shape. Effluxed clear urine.  Bladder:  A systematic examination of her bladder was performed. Her reimplanted left ureteral orifice was identified and appears to be effluxing clear urine. Her right ureteral orifice is in the expected anatomic location and effluxing clear urine. Toward the right side of the dome of the bladder, she does have at least 2 and possibly 3 small less than 1 cm papillary tumor recurrences. A bladder washing was obtained for cytology.      Chaperone: SM The lower urinary tract was carefully examined. The procedure was well-tolerated and without complications. Antibiotic instructions were given. Instructions were given to call the office immediately for bloody urine, difficulty urinating, urinary retention, painful or frequent urination, fever, chills, nausea, vomiting or other illness. The patient stated that she understood these instructions and would comply with them.         Visit Complexity - G2211          Urinalysis Dipstick Dipstick  Cont'd  Color: Yellow Bilirubin: Neg mg/dL  Appearance: Clear Ketones: Trace mg/dL  Specific Gravity: 8.984 Blood: Neg ery/uL  pH: 6.5 Protein: Neg mg/dL  Glucose: 3+ mg/dL Urobilinogen: 0.2 mg/dL    Nitrites: Neg    Leukocyte Esterase: Neg leu/uL    ASSESSMENT:      ICD-10 Details  1 GU:   Bladder Cancer overlapping sites - C67.8    PLAN:           Orders Labs Urine Cytology  Lab Notes: Washing          Schedule Return Visit/Planned Activity: Other See Visit Notes             Note: Will call to  schedule surgery.          Document Letter(s):  Created for Patient: Clinical Summary         Notes:   1. Bladder cancer: She does appear to have small papillary recurrences most likely consistent with low risk disease. I have recommended she proceed with cystoscopy and transurethral resection of her bladder tumors. We have reviewed this procedure in detail including the potential risks and complications. This will be scheduled.   CC: Angeline Parkin, NP    * Signed by Gretel Ferrara, M.D. on 10/20/23 at 7:47 AM (EDT)*

## 2023-11-08 ENCOUNTER — Ambulatory Visit (HOSPITAL_COMMUNITY): Payer: Self-pay | Admitting: Physician Assistant

## 2023-11-08 ENCOUNTER — Ambulatory Visit (HOSPITAL_BASED_OUTPATIENT_CLINIC_OR_DEPARTMENT_OTHER): Payer: Self-pay | Admitting: Physician Assistant

## 2023-11-08 ENCOUNTER — Other Ambulatory Visit: Payer: Self-pay

## 2023-11-08 ENCOUNTER — Ambulatory Visit (HOSPITAL_COMMUNITY)

## 2023-11-08 ENCOUNTER — Ambulatory Visit (HOSPITAL_COMMUNITY)
Admission: RE | Admit: 2023-11-08 | Discharge: 2023-11-08 | Disposition: A | Discharge status: Home / Self Care | Source: Ambulatory Visit | Attending: Urology | Admitting: Urology

## 2023-11-08 ENCOUNTER — Encounter (HOSPITAL_COMMUNITY): Payer: Self-pay | Admitting: Urology

## 2023-11-08 ENCOUNTER — Encounter (HOSPITAL_COMMUNITY)
Admission: RE | Disposition: A | Discharge status: Home / Self Care | Payer: Self-pay | Source: Ambulatory Visit | Attending: Urology

## 2023-11-08 DIAGNOSIS — E876 Hypokalemia: Secondary | ICD-10-CM

## 2023-11-08 DIAGNOSIS — F17200 Nicotine dependence, unspecified, uncomplicated: Secondary | ICD-10-CM | POA: Insufficient documentation

## 2023-11-08 DIAGNOSIS — N189 Chronic kidney disease, unspecified: Secondary | ICD-10-CM | POA: Diagnosis not present

## 2023-11-08 DIAGNOSIS — C679 Malignant neoplasm of bladder, unspecified: Secondary | ICD-10-CM | POA: Diagnosis not present

## 2023-11-08 DIAGNOSIS — I129 Hypertensive chronic kidney disease with stage 1 through stage 4 chronic kidney disease, or unspecified chronic kidney disease: Secondary | ICD-10-CM | POA: Insufficient documentation

## 2023-11-08 DIAGNOSIS — E1122 Type 2 diabetes mellitus with diabetic chronic kidney disease: Secondary | ICD-10-CM | POA: Insufficient documentation

## 2023-11-08 DIAGNOSIS — C671 Malignant neoplasm of dome of bladder: Secondary | ICD-10-CM | POA: Diagnosis not present

## 2023-11-08 DIAGNOSIS — C678 Malignant neoplasm of overlapping sites of bladder: Secondary | ICD-10-CM | POA: Diagnosis present

## 2023-11-08 HISTORY — PX: TRANSURETHRAL RESECTION OF BLADDER TUMOR: SHX2575

## 2023-11-08 HISTORY — PX: CYSTOSCOPY W/ RETROGRADES: SHX1426

## 2023-11-08 LAB — GLUCOSE, CAPILLARY
Glucose-Capillary: 96 mg/dL (ref 70–99)
Glucose-Capillary: 75 mg/dL (ref 70–99)

## 2023-11-08 LAB — BASIC METABOLIC PANEL WITH GFR
Anion gap: 12 (ref 5–15)
BUN: 10 mg/dL (ref 8–23)
CO2: 25 mmol/L (ref 22–32)
Calcium: 8.9 mg/dL (ref 8.9–10.3)
Chloride: 103 mmol/L (ref 98–111)
Creatinine, Ser: 0.65 mg/dL (ref 0.44–1.00)
GFR, Estimated: >60 mL/min (ref >60)
Glucose, Bld: 89 mg/dL (ref 70–99)
Potassium: 3 mmol/L — ABNORMAL LOW (ref 3.5–5.1)
Sodium: 140 mmol/L (ref 135–145)

## 2023-11-08 SURGERY — TURBT (TRANSURETHRAL RESECTION OF BLADDER TUMOR)
Anesthesia: General | Site: Pelvis

## 2023-11-08 MED ORDER — LIDOCAINE HCL (PF) 2 % IJ SOLN
INTRAMUSCULAR | Status: AC
  Filled 2023-11-08: qty 5

## 2023-11-08 MED ORDER — FENTANYL CITRATE (PF) 100 MCG/2ML IJ SOLN
INTRAMUSCULAR | Status: AC
  Filled 2023-11-08: qty 2

## 2023-11-08 MED ORDER — EPHEDRINE 5 MG/ML INJ
INTRAVENOUS | Status: AC
  Filled 2023-11-08: qty 5

## 2023-11-08 MED ORDER — EPHEDRINE SULFATE-NACL 50-0.9 MG/10ML-% IV SOSY
PREFILLED_SYRINGE | INTRAVENOUS | Status: DC | PRN
  Administered 2023-11-08: 15 mg via INTRAVENOUS
  Administered 2023-11-08 (×2): 10 mg via INTRAVENOUS

## 2023-11-08 MED ORDER — PHENYLEPHRINE 80 MCG/ML (10ML) SYRINGE FOR IV PUSH (FOR BLOOD PRESSURE SUPPORT)
PREFILLED_SYRINGE | INTRAVENOUS | Status: DC | PRN
Start: 2023-11-08 — End: 2023-11-08
  Administered 2023-11-08: 240 ug via INTRAVENOUS
  Administered 2023-11-08: 160 ug via INTRAVENOUS

## 2023-11-08 MED ORDER — EPHEDRINE 5 MG/ML INJ
INTRAVENOUS | Status: AC
Start: 2023-11-08 — End: 2023-11-08
  Filled 2023-11-08: qty 5

## 2023-11-08 MED ORDER — PROPOFOL 10 MG/ML IV BOLUS
INTRAVENOUS | Status: DC | PRN
  Administered 2023-11-08: 100 mg via INTRAVENOUS

## 2023-11-08 MED ORDER — SODIUM CHLORIDE 0.9 % IR SOLN
Status: DC | PRN
  Administered 2023-11-08: 3000 mL via INTRAVESICAL

## 2023-11-08 MED ORDER — LIDOCAINE HCL (PF) 2 % IJ SOLN
INTRAMUSCULAR | Status: DC | PRN
  Administered 2023-11-08: 60 mg via INTRADERMAL

## 2023-11-08 MED ORDER — DEXAMETHASONE SODIUM PHOSPHATE 10 MG/ML IJ SOLN
INTRAMUSCULAR | Status: AC
  Filled 2023-11-08: qty 1

## 2023-11-08 MED ORDER — ONDANSETRON HCL 4 MG/2ML IJ SOLN
INTRAMUSCULAR | Status: DC | PRN
  Administered 2023-11-08: 4 mg via INTRAVENOUS

## 2023-11-08 MED ORDER — CHLORHEXIDINE GLUCONATE 0.12 % MT SOLN
15.0000 mL | Freq: Once | OROMUCOSAL | Status: AC
  Administered 2023-11-08: 15 mL via OROMUCOSAL

## 2023-11-08 MED ORDER — ORAL CARE MOUTH RINSE: 15.0000 mL | Freq: Once | OROMUCOSAL | Status: AC

## 2023-11-08 MED ORDER — PHENYLEPHRINE 80 MCG/ML (10ML) SYRINGE FOR IV PUSH (FOR BLOOD PRESSURE SUPPORT)
PREFILLED_SYRINGE | INTRAVENOUS | Status: AC
  Filled 2023-11-08: qty 10

## 2023-11-08 MED ORDER — DEXAMETHASONE SODIUM PHOSPHATE 10 MG/ML IJ SOLN
INTRAMUSCULAR | Status: DC | PRN
  Administered 2023-11-08: 10 mg via INTRAVENOUS

## 2023-11-08 MED ORDER — PROPOFOL 10 MG/ML IV BOLUS
INTRAVENOUS | Status: AC
  Filled 2023-11-08: qty 20

## 2023-11-08 MED ORDER — CEFAZOLIN SODIUM-DEXTROSE 2-4 GM/100ML-% IV SOLN
2.0000 g | INTRAVENOUS | Status: AC
  Administered 2023-11-08: 2 g via INTRAVENOUS
  Filled 2023-11-08: qty 100

## 2023-11-08 MED ORDER — ROCURONIUM BROMIDE 10 MG/ML (PF) SYRINGE
PREFILLED_SYRINGE | INTRAVENOUS | Status: AC
  Filled 2023-11-08: qty 10

## 2023-11-08 MED ORDER — ONDANSETRON HCL 4 MG/2ML IJ SOLN
INTRAMUSCULAR | Status: AC
  Filled 2023-11-08: qty 2

## 2023-11-08 MED ORDER — LACTATED RINGERS IV SOLN: INTRAVENOUS | Status: DC

## 2023-11-08 MED ORDER — IOPAMIDOL (ISOVUE-300) INJECTION 61%
INTRAVENOUS | Status: DC | PRN
  Administered 2023-11-08: 26 mL

## 2023-11-08 MED ORDER — FENTANYL CITRATE (PF) 100 MCG/2ML IJ SOLN
INTRAMUSCULAR | Status: DC | PRN
  Administered 2023-11-08: 25 ug via INTRAVENOUS

## 2023-11-08 SURGICAL SUPPLY — 17 items
BAG URINE DRAIN 2000ML AR STRL (UROLOGICAL SUPPLIES) IMPLANT
BAG URO CATCHER STRL LF (MISCELLANEOUS) ×2 IMPLANT
CATH URETL OPEN END 6FR 70 (CATHETERS) IMPLANT
CLOTH BEACON ORANGE TIMEOUT ST (SAFETY) ×2 IMPLANT
DRAPE FOOT SWITCH (DRAPES) ×2 IMPLANT
ELECT REM PT RETURN 15FT ADLT (MISCELLANEOUS) ×2 IMPLANT
GLOVE SURG LX STRL 7.5 STRW (GLOVE) ×2 IMPLANT
GOWN STRL REUS W/ TWL XL LVL3 (GOWN DISPOSABLE) ×2 IMPLANT
GUIDEWIRE STR DUAL SENSOR (WIRE) ×2 IMPLANT
KIT TURNOVER KIT A (KITS) ×2 IMPLANT
LOOP CUT BIPOLAR 24F LRG (ELECTROSURGICAL) IMPLANT
MANIFOLD NEPTUNE II (INSTRUMENTS) ×2 IMPLANT
NS IRRIG 1000ML POUR BTL (IV SOLUTION) IMPLANT
PACK CYSTO (CUSTOM PROCEDURE TRAY) ×2 IMPLANT
SYRINGE TOOMEY IRRIG 70ML (MISCELLANEOUS) IMPLANT
TUBING CONNECTING 10 (TUBING) ×2 IMPLANT
TUBING UROLOGY SET (TUBING) ×2 IMPLANT

## 2023-11-08 NOTE — Discharge Instructions (Addendum)
 You may see some blood in the urine and may have some burning with urination for 48-72 hours. You also may notice that you have to urinate more frequently or urgently after your procedure which is normal.  You should call should you develop an inability urinate, fever > 101, persistent nausea and vomiting that prevents you from eating or drinking to stay hydrated.

## 2023-11-08 NOTE — Anesthesia Procedure Notes (Addendum)
 Procedure Name: LMA Insertion Date/Time: 11/08/2023 12:49 PM  Performed by: Carleton Garnette SAUNDERS, CRNAPre-anesthesia Checklist: Patient identified, Emergency Drugs available, Suction available and Patient being monitored Patient Re-evaluated:Patient Re-evaluated prior to induction Oxygen Delivery Method: Circle system utilized Preoxygenation: Pre-oxygenation with 100% oxygen Induction Type: IV induction LMA: LMA inserted LMA Size: 4.0 Tube type: Oral Number of attempts: 1 Placement Confirmation: positive ETCO2 and breath sounds checked- equal and bilateral Tube secured with: Tape Dental Injury: Teeth and Oropharynx as per pre-operative assessment

## 2023-11-08 NOTE — Anesthesia Preprocedure Evaluation (Addendum)
 Anesthesia Evaluation  Patient identified by MRN, date of birth, ID band Patient awake    Reviewed: Allergy & Precautions, NPO status , Patient's Chart, lab work & pertinent test results  Airway Mallampati: I  TM Distance: >3 FB Neck ROM: Full    Dental  (+) Edentulous Upper, Edentulous Lower   Pulmonary Current Smoker   breath sounds clear to auscultation       Cardiovascular hypertension, Pt. on home beta blockers + Valvular Problems/Murmurs  Rhythm:Regular Rate:Normal     Neuro/Psych negative neurological ROS  negative psych ROS   GI/Hepatic negative GI ROS, Neg liver ROS,,,  Endo/Other  diabetes, Type 2    Renal/GU Renal disease     Musculoskeletal  (+) Arthritis ,    Abdominal   Peds  Hematology negative hematology ROS (+)   Anesthesia Other Findings   Reproductive/Obstetrics                             Anesthesia Physical Anesthesia Plan  ASA: 2  Anesthesia Plan: General   Post-op Pain Management: Tylenol  PO (pre-op)*   Induction: Intravenous  PONV Risk Score and Plan: 3 and Ondansetron , Dexamethasone  and Midazolam   Airway Management Planned: LMA  Additional Equipment: None  Intra-op Plan:   Post-operative Plan: Extubation in OR  Informed Consent: I have reviewed the patients History and Physical, chart, labs and discussed the procedure including the risks, benefits and alternatives for the proposed anesthesia with the patient or authorized representative who has indicated his/her understanding and acceptance.       Plan Discussed with: CRNA  Anesthesia Plan Comments:        Anesthesia Quick Evaluation

## 2023-11-08 NOTE — Transfer of Care (Signed)
 Immediate Anesthesia Transfer of Care Note  Patient: Jill Fox  Procedure(s) Performed: TURBT (TRANSURETHRAL RESECTION OF BLADDER TUMOR) (Bladder) CYSTOSCOPY, WITH RETROGRADE PYELOGRAM (Bilateral: Pelvis)  Patient Location: PACU  Anesthesia Type:General  Level of Consciousness: sedated  Airway & Oxygen Therapy: Patient Spontanous Breathing and Patient connected to face mask oxygen  Post-op Assessment: Report given to RN and Post -op Vital signs reviewed and stable  Post vital signs: Reviewed and stable  Last Vitals:  Vitals Value Taken Time  BP 109/55 11/08/23 13:24  Temp    Pulse 70 11/08/23 13:27  Resp 15 11/08/23 13:27  SpO2 100 % 11/08/23 13:27  Vitals shown include unfiled device data.  Last Pain:  Vitals:   11/08/23 1151  TempSrc:   PainSc: 0-No pain         Complications: No notable events documented.

## 2023-11-08 NOTE — Op Note (Signed)
 Preoperative diagnosis:  1.  Bladder tumor (less than 1 cm)  Postoperative diagnosis: 1.  Bladder tumor (less than 1 cm)  Procedure(s): 1.  Cystoscopy 2.  Transurethral resection of bladder tumor (less than 1 cm) 3.  Bilateral retrograde pyelography with interpretation  Surgeon: Dr. Gretel CANDIE Ferrara, Jr  Anesthesia: General  Complications: None  EBL: Minimal  Specimens: 1.  Bladder dome tumor 2.  Biopsy of bladder tumor base  Disposition of specimens: Pathology  Intraoperative findings: Bilateral retrograde pyelograms were performed with Omnipaque  contrast and 6 French ureteral catheters.  On the right side, there appeared to be no renal collecting system or ureteral filling defects or other significant abnormalities.  On the left side, she does have a reimplanted ureter.  The ureter appeared normal without filling defects.  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  Indication: Ms. Heming is a 73 year old female with a history of recurrent low-grade urothelial carcinoma.  She was recently identified to have a small tumor at the dome of her bladder on surveillance cystoscopy and presents today for the above procedures.  We have discussed the potential risks, complications, and expected recovery process and she gives informed consent to proceed.  Description of procedure:  The patient was taken to the operating room and a general anesthetic was administered.  She was given preoperative antibiotics, placed in the dorsolithotomy position, and prepped and draped in the usual sterile fashion.  Next, a preoperative timeout was performed.  Cystourethroscopy was then performed and the bladder was carefully examined with both a 30 degree and 70 degree lens.  There was noted to be a small papillary bladder tumor of the dome of the bladder as previously mentioned.  There were some  other mucosal areas that appeared very mildly erythematous but without actual visible tumor growth noted.  Attention then turned to the right ureteral orifice which was cannulated with a 6 French ureteral catheter.  Omnipaque  contrast was injected to perform a retrograde pyelogram.  Findings are as dictated above.  Attention then turned to the contralateral reimplanted ureter which was also cannulated and Omnipaque  contrast was also injected thereby performing a retrograde pyelogram on this side.  Findings are as dictated above.  Attention then returned to the bladder.  Based on the location and size of the tumor, it was decided to use a cold cup biopsy forceps to perform transurethral removal of the papillary bladder tumor.  This seemed to excise the entire tumor.  An additional biopsy was then performed of the base of this tumor site.  This area was then fulgurated with a Bugbee electrode resulting in excellent hemostasis.  Some of the other abnormal mucosal areas that may have been previously mentioned were then fulgurated.  Again, no other tumors or other abnormalities were identified.  The patient's bladder was emptied and the procedure was ended.  She tolerated the procedure well without complications and was able to be awakened and transferred to the recovery unit in satisfactory condition.

## 2023-11-08 NOTE — Interval H&P Note (Signed)
 History and Physical Interval Note:  11/08/2023 12:04 PM  Jill Fox  has presented today for surgery, with the diagnosis of BLADDER CANCER.  The various methods of treatment have been discussed with the patient and family. After consideration of risks, benefits and other options for treatment, the patient has consented to  Procedure(s): TURBT (TRANSURETHRAL RESECTION OF BLADDER TUMOR) (N/A) CYSTOSCOPY, WITH RETROGRADE PYELOGRAM (Bilateral) as a surgical intervention.  The patient's history has been reviewed, patient examined, no change in status, stable for surgery.  I have reviewed the patient's chart and labs.  Questions were answered to the patient's satisfaction.     [employer]

## 2023-11-09 ENCOUNTER — Encounter (HOSPITAL_COMMUNITY): Payer: Self-pay | Admitting: Urology

## 2023-11-09 LAB — SURGICAL PATHOLOGY

## 2023-11-09 NOTE — Anesthesia Postprocedure Evaluation (Signed)
 Anesthesia Post Note  Patient: Jill Fox  Procedure(s) Performed: TURBT (TRANSURETHRAL RESECTION OF BLADDER TUMOR) (Bladder) CYSTOSCOPY, WITH RETROGRADE PYELOGRAM (Bilateral: Pelvis)     Patient location during evaluation: PACU Anesthesia Type: General Level of consciousness: awake and alert Pain management: pain level controlled Vital Signs Assessment: post-procedure vital signs reviewed and stable Respiratory status: spontaneous breathing, nonlabored ventilation, respiratory function stable and patient connected to nasal cannula oxygen Cardiovascular status: blood pressure returned to baseline and stable Postop Assessment: no apparent nausea or vomiting Anesthetic complications: no   No notable events documented.  Last Vitals:  Vitals:   11/08/23 1415 11/08/23 1422  BP: 127/69 127/69  Pulse: 80 78  Resp: 14 17  Temp:  36.6 C  SpO2: 99% 100%    Last Pain:  Vitals:   11/08/23 1422  TempSrc: Oral  PainSc: 0-No pain                 Franky JONETTA Bald

## 2024-03-23 ENCOUNTER — Other Ambulatory Visit: Payer: Self-pay | Admitting: Family

## 2024-03-23 DIAGNOSIS — Z1231 Encounter for screening mammogram for malignant neoplasm of breast: Secondary | ICD-10-CM

## 2024-03-31 ENCOUNTER — Inpatient Hospital Stay: Admission: RE | Admit: 2024-03-31 | Payer: Medicare HMO | Source: Ambulatory Visit

## 2024-03-31 ENCOUNTER — Inpatient Hospital Stay: Admission: RE | Admit: 2024-03-31 | Source: Ambulatory Visit
